# Patient Record
Sex: Female | Born: 1980 | Hispanic: No | Marital: Married | Smoking: Never smoker
Health system: Southern US, Community
[De-identification: ages and names within clinical notes are randomized; demographics above are authoritative.]

## PROBLEM LIST (undated history)

## (undated) DIAGNOSIS — Z8611 Personal history of tuberculosis: Secondary | ICD-10-CM

## (undated) DIAGNOSIS — O24419 Gestational diabetes mellitus in pregnancy, unspecified control: Secondary | ICD-10-CM

## (undated) DIAGNOSIS — Z87440 Personal history of urinary (tract) infections: Secondary | ICD-10-CM

## (undated) HISTORY — DX: Gestational diabetes mellitus in pregnancy, unspecified control: O24.419

## (undated) HISTORY — PX: NO PAST SURGERIES: SHX2092

## (undated) HISTORY — DX: Personal history of tuberculosis: Z86.11

## (undated) HISTORY — DX: Personal history of urinary (tract) infections: Z87.440

---

## 2011-04-20 ENCOUNTER — Ambulatory Visit
Admission: RE | Admit: 2011-04-20 | Discharge: 2011-04-20 | Disposition: A | Discharge status: Home / Self Care | Payer: No Typology Code available for payment source | Source: Ambulatory Visit | Attending: Infectious Diseases | Admitting: Infectious Diseases

## 2011-04-20 ENCOUNTER — Other Ambulatory Visit: Payer: Self-pay | Admitting: Infectious Diseases

## 2011-04-20 DIAGNOSIS — R9389 Abnormal findings on diagnostic imaging of other specified body structures: Secondary | ICD-10-CM

## 2011-08-17 NOTE — L&D Delivery Note (Signed)
Delivery Note At 1:45 PM a viable female was delivered via Vaginal, Vacuum (Extractor) (Presentation: Left Occiput Anterior).  Indication for vacuum was inadequate maternal pushing.  This was at least partly due to a severe language barrier, despite use of language line.  APGAR: 7, 9; weight 6 lb 9.5 oz (2991 g).   Placenta status: Intact, Spontaneous.  Cord: 3 vessels with the following complications: None.  Cord pH: pending.  Anesthesia: Epidural  Episiotomy: None Lacerations: partial 3rd degree perineal tear, side wall laceration, both in need of suture repair. Suture Repair: 3.0 vicryl vicryl rapide Est. Blood Loss (mL): 400  Mom to postpartum.  Baby to nursery-stable.  Sonia Side 06/07/2012, 2:35 PM

## 2011-12-21 ENCOUNTER — Other Ambulatory Visit (HOSPITAL_COMMUNITY): Payer: Self-pay | Admitting: Physician Assistant

## 2011-12-21 DIAGNOSIS — Z3689 Encounter for other specified antenatal screening: Secondary | ICD-10-CM

## 2011-12-21 LAB — CYTOLOGY - PAP: Pap Smear: NEGATIVE

## 2011-12-21 LAB — OB RESULTS CONSOLE HEPATITIS B SURFACE ANTIGEN: Hepatitis B Surface Ag: NEGATIVE

## 2011-12-21 LAB — OB RESULTS CONSOLE RPR: RPR: NONREACTIVE

## 2011-12-21 LAB — OB RESULTS CONSOLE ABO/RH: RH Type: POSITIVE

## 2011-12-21 LAB — OB RESULTS CONSOLE ANTIBODY SCREEN: Antibody Screen: NEGATIVE

## 2012-01-04 ENCOUNTER — Ambulatory Visit (HOSPITAL_COMMUNITY)
Admission: RE | Admit: 2012-01-04 | Discharge: 2012-01-04 | Disposition: A | Discharge status: Home / Self Care | Payer: Medicaid Other | Source: Ambulatory Visit | Attending: Physician Assistant | Admitting: Physician Assistant

## 2012-01-04 DIAGNOSIS — Z3689 Encounter for other specified antenatal screening: Secondary | ICD-10-CM | POA: Insufficient documentation

## 2012-03-16 ENCOUNTER — Other Ambulatory Visit (HOSPITAL_COMMUNITY): Payer: Self-pay | Admitting: Physician Assistant

## 2012-03-16 DIAGNOSIS — Z09 Encounter for follow-up examination after completed treatment for conditions other than malignant neoplasm: Secondary | ICD-10-CM

## 2012-03-23 ENCOUNTER — Ambulatory Visit (HOSPITAL_COMMUNITY)
Admission: RE | Admit: 2012-03-23 | Discharge: 2012-03-23 | Disposition: A | Discharge status: Home / Self Care | Payer: Medicaid Other | Source: Ambulatory Visit | Attending: Physician Assistant | Admitting: Physician Assistant

## 2012-03-23 DIAGNOSIS — Z09 Encounter for follow-up examination after completed treatment for conditions other than malignant neoplasm: Secondary | ICD-10-CM

## 2012-03-23 DIAGNOSIS — O365990 Maternal care for other known or suspected poor fetal growth, unspecified trimester, not applicable or unspecified: Secondary | ICD-10-CM | POA: Insufficient documentation

## 2012-03-23 DIAGNOSIS — Z3689 Encounter for other specified antenatal screening: Secondary | ICD-10-CM | POA: Insufficient documentation

## 2012-04-13 ENCOUNTER — Other Ambulatory Visit (HOSPITAL_COMMUNITY): Payer: Self-pay | Admitting: Family

## 2012-04-13 DIAGNOSIS — Z1389 Encounter for screening for other disorder: Secondary | ICD-10-CM

## 2012-04-19 ENCOUNTER — Ambulatory Visit (HOSPITAL_COMMUNITY): Payer: Medicaid Other

## 2012-04-19 ENCOUNTER — Ambulatory Visit (HOSPITAL_COMMUNITY)
Admission: RE | Admit: 2012-04-19 | Discharge: 2012-04-19 | Disposition: A | Discharge status: Home / Self Care | Payer: Medicaid Other | Source: Ambulatory Visit | Attending: Family | Admitting: Family

## 2012-04-19 DIAGNOSIS — Z1389 Encounter for screening for other disorder: Secondary | ICD-10-CM

## 2012-04-19 DIAGNOSIS — O365990 Maternal care for other known or suspected poor fetal growth, unspecified trimester, not applicable or unspecified: Secondary | ICD-10-CM | POA: Insufficient documentation

## 2012-04-19 DIAGNOSIS — O358XX0 Maternal care for other (suspected) fetal abnormality and damage, not applicable or unspecified: Secondary | ICD-10-CM | POA: Insufficient documentation

## 2012-05-29 ENCOUNTER — Other Ambulatory Visit (HOSPITAL_COMMUNITY): Payer: Self-pay | Admitting: Nurse Practitioner

## 2012-05-29 ENCOUNTER — Ambulatory Visit (HOSPITAL_COMMUNITY)
Admission: RE | Admit: 2012-05-29 | Discharge: 2012-05-29 | Disposition: A | Discharge status: Home / Self Care | Payer: Medicaid Other | Source: Ambulatory Visit | Attending: Nurse Practitioner | Admitting: Nurse Practitioner

## 2012-05-29 DIAGNOSIS — O358XX0 Maternal care for other (suspected) fetal abnormality and damage, not applicable or unspecified: Secondary | ICD-10-CM | POA: Insufficient documentation

## 2012-05-29 DIAGNOSIS — O365990 Maternal care for other known or suspected poor fetal growth, unspecified trimester, not applicable or unspecified: Secondary | ICD-10-CM | POA: Insufficient documentation

## 2012-06-02 ENCOUNTER — Encounter (HOSPITAL_COMMUNITY): Payer: Self-pay | Admitting: *Deleted

## 2012-06-02 ENCOUNTER — Telehealth (HOSPITAL_COMMUNITY): Payer: Self-pay | Admitting: *Deleted

## 2012-06-02 NOTE — Telephone Encounter (Signed)
Preadmission screen Interpreter number [phone]

## 2012-06-06 ENCOUNTER — Inpatient Hospital Stay (HOSPITAL_COMMUNITY)
Admission: AD | Admit: 2012-06-06 | Discharge: 2012-06-09 | DRG: 775 | Disposition: A | Discharge status: Home / Self Care | Payer: Medicaid Other | Source: Ambulatory Visit | Attending: Obstetrics & Gynecology | Admitting: Obstetrics & Gynecology

## 2012-06-06 ENCOUNTER — Inpatient Hospital Stay (HOSPITAL_COMMUNITY): Admission: AD | Admit: 2012-06-06 | Payer: Self-pay | Source: Ambulatory Visit | Admitting: Obstetrics & Gynecology

## 2012-06-06 ENCOUNTER — Telehealth: Payer: Self-pay | Admitting: Obstetrics & Gynecology

## 2012-06-06 ENCOUNTER — Ambulatory Visit (INDEPENDENT_AMBULATORY_CARE_PROVIDER_SITE_OTHER): Payer: Medicaid Other | Admitting: *Deleted

## 2012-06-06 ENCOUNTER — Encounter (HOSPITAL_COMMUNITY): Payer: Self-pay | Admitting: *Deleted

## 2012-06-06 VITALS — BP 104/68 | Wt 142.6 lb

## 2012-06-06 DIAGNOSIS — O480 Post-term pregnancy: Secondary | ICD-10-CM | POA: Diagnosis present

## 2012-06-06 DIAGNOSIS — O359XX0 Maternal care for (suspected) fetal abnormality and damage, unspecified, not applicable or unspecified: Secondary | ICD-10-CM

## 2012-06-06 DIAGNOSIS — O4100X0 Oligohydramnios, unspecified trimester, not applicable or unspecified: Secondary | ICD-10-CM | POA: Insufficient documentation

## 2012-06-06 LAB — CBC
HCT: 36.8 % (ref 36.0–46.0)
Hemoglobin: 12.4 g/dL (ref 12.0–15.0)
MCH: 28.4 pg (ref 26.0–34.0)
MCV: 84.4 fL (ref 78.0–100.0)
RBC: 4.36 MIL/uL (ref 3.87–5.11)

## 2012-06-06 MED ORDER — ACETAMINOPHEN 325 MG PO TABS: 650.0000 mg | ORAL_TABLET | ORAL | Status: DC | PRN

## 2012-06-06 MED ORDER — CITRIC ACID-SODIUM CITRATE 334-500 MG/5ML PO SOLN: 30.0000 mL | ORAL | Status: DC | PRN

## 2012-06-06 MED ORDER — OXYTOCIN 40 UNITS IN LACTATED RINGERS INFUSION - SIMPLE MED
62.5000 mL/h | INTRAVENOUS | Status: DC
  Filled 2012-06-06: qty 1000

## 2012-06-06 MED ORDER — LIDOCAINE HCL (PF) 1 % IJ SOLN: 30.0000 mL | INTRAMUSCULAR | Status: DC | PRN

## 2012-06-06 MED ORDER — LACTATED RINGERS IV SOLN: 500.0000 mL | INTRAVENOUS | Status: DC | PRN

## 2012-06-06 MED ORDER — OXYTOCIN BOLUS FROM INFUSION
500.0000 mL | INTRAVENOUS | Status: DC
  Filled 2012-06-06 (×94): qty 500

## 2012-06-06 MED ORDER — MISOPROSTOL 25 MCG QUARTER TABLET
25.0000 ug | ORAL_TABLET | ORAL | Status: DC | PRN
  Administered 2012-06-06 – 2012-06-07 (×3): 25 ug via VAGINAL
  Filled 2012-06-06 (×3): qty 0.25

## 2012-06-06 MED ORDER — OXYCODONE-ACETAMINOPHEN 5-325 MG PO TABS: 1.0000 | ORAL_TABLET | ORAL | Status: DC | PRN

## 2012-06-06 MED ORDER — TERBUTALINE SULFATE 1 MG/ML IJ SOLN: 0.2500 mg | Freq: Once | INTRAMUSCULAR | Status: AC | PRN

## 2012-06-06 MED ORDER — ONDANSETRON HCL 4 MG/2ML IJ SOLN: 4.0000 mg | Freq: Four times a day (QID) | INTRAMUSCULAR | Status: DC | PRN

## 2012-06-06 MED ORDER — LACTATED RINGERS IV SOLN
INTRAVENOUS | Status: DC
  Administered 2012-06-06 – 2012-06-07 (×3): via INTRAVENOUS

## 2012-06-06 MED ORDER — FENTANYL CITRATE 0.05 MG/ML IJ SOLN
100.0000 ug | INTRAMUSCULAR | Status: DC | PRN
  Administered 2012-06-07 (×3): 100 ug via INTRAVENOUS
  Filled 2012-06-06 (×3): qty 2

## 2012-06-06 MED ORDER — IBUPROFEN 600 MG PO TABS
600.0000 mg | ORAL_TABLET | Freq: Four times a day (QID) | ORAL | Status: DC | PRN
  Administered 2012-06-07: 600 mg via ORAL
  Filled 2012-06-06: qty 1

## 2012-06-06 NOTE — Telephone Encounter (Signed)
OB Attending Phone Call  Patient was called at home with the help a [facility] interpreter for Burmese [phone].  She was asked why she has not shown up for her recommended induction of labor for oligohydramnios at [redacted]w[redacted]d. Patient reports that her husband just returned home and will be bringing her to the hospital soon.  Admitting staff informed of patient's expected arrival soon; L&D aware.  Emphasized importance of coming in soon as recommended given concern about oligohydramnios and possible worsening fetal condition.  Jaynie Collins, MD, FACOG Attending Obstetrician & Gynecologist Faculty Practice, [facility]

## 2012-06-06 NOTE — Progress Notes (Signed)
P - 69 

## 2012-06-06 NOTE — Progress Notes (Signed)
[employer] Interpretor on telephone with Dr. Claiborne Billings in room to discuss plan of care.

## 2012-06-06 NOTE — Progress Notes (Signed)
Postdates NST performed today was reviewed and was found to be reactive. However, the AFI was 4 cm, cephalic. Given oligohydramnios, will proceed with IOL. Patient informed about recommendation using a physical Burmese interpreter, and she agreed with plan. She will go home briefly and return for IOL as a direct admission to L&D.  L&D charge RN, Admissions notified.

## 2012-06-06 NOTE — Progress Notes (Signed)
Destony Pounders is a 31 y.o. G1P0000 at [redacted]w[redacted]d by LMP admitted for induction of labor due to Post dates. Due date Estimated Date of Delivery: 06/02/12 and oligohydramnios.  .  Subjective: Patient not uncomfortable. Father of baby going home for the evening. Translator used for communication.  Objective: BP 116/74  Pulse 69  Temp 97.3 F (36.3 C) (Axillary)  Resp 20  Ht 5' (1.524 m)  Wt 64.864 kg (143 lb)  BMI 27.93 kg/m2  LMP 08/27/2011      FHT:  FHR: 135 bpm, variability: moderate,  accelerations:  Present,  decelerations:  Absent UC:   irregular, every 3-10 minutes SVE:   FT/thick/posterior (unchanged from prior exam Labs: Lab Results  Component Value Date   WBC 8.3 06/06/2012   HGB 12.4 06/06/2012   HCT 36.8 06/06/2012   MCV 84.4 06/06/2012   PLT 198 06/06/2012    Assessment / Plan: Induction of labor due to oligohydramnios,  progressing well on pitocin Cytotec #2 placed  Labor: Cytotec cervical ripening Preeclampsia:  labs stable and no PreE Fetal Wellbeing:  Category I Pain Control:  No pain I/D:none Anticipated MOD:  NSVD  Arham Symmonds 06/06/2012, 9:47 PM

## 2012-06-06 NOTE — H&P (Signed)
Drinda Coole is a 31 y.o. female presenting for IOL due to oligo (AFI 4) History Patient has not had vaginal bleeding, contractions, or fluid leakage.  She has felt normal fetal movement in past hour.  Patient was found to have decreased amniotic fluid index of 4 in clinic, scheduled for induction today.  No other complications during her pregnancy.  She takes not medications other than a prenatal vitamin.  She has had no previous pregnancies, deliveries, or C-sections.  She denies any other symptoms.  OB History    Grav Para Term Preterm Abortions TAB SAB Ect Mult Living   1 0 0 0 0 0 0 0 0 0      Past Medical History  Diagnosis Date  . Hx of tuberculosis     treated  . Hx: UTI (urinary tract infection)    Past Surgical History  Procedure Date  . No past surgeries    Family History: family history is not on file. Social History:  reports that she has never smoked. She has never used smokeless tobacco. She reports that she does not drink alcohol or use illicit drugs.   Prenatal Transfer Tool  Maternal Diabetes: No Genetic Screening: Normal Maternal Ultrasounds/Referrals: Abnormal:  Findings:   Fetal Kidney Anomalies bilateral pyelectasis Fetal Ultrasounds or other Referrals:  None Maternal Substance Abuse:  No Significant Maternal Medications:  None Significant Maternal Lab Results:  None Other Comments:  None  ROS  Dilation: Fingertip Effacement (%): Thick Station: -3 Exam by:: Kyriakos Babler Blood pressure 117/72, pulse 79, temperature 97.6 F (36.4 C), temperature source Oral, resp. rate 20, height 5' (1.524 m), weight 64.864 kg (143 lb), last menstrual period 08/27/2011.  Exam Physical Exam  Gen: AAOx3, NAD CV: RRR, no MRG, pulses intact b/l Pulm: CTA Abd: Soft, NT/ND, +BS Cervical exam:Dilation: Fingertip Effacement (%): Thick Station: -3 Presentation: Vertex Exam by:: Delta Deshmukh  FHT:  Prenatal labs: ABO, Rh: B/Positive/-- (05/07 0000) Antibody: Negative (05/07  0000) Rubella: Immune (05/07 0000) RPR: Nonreactive (05/07 0000)  HBsAg: Negative (05/07 0000)  HIV: Non-reactive (05/07 0000)  GBS: Negative (10/01 0000)   FHT: BHR: 150, accels present, decels present: 1 possible decel >2 min, <10 with minimal variability after return to baseline.  Moderate variability, contractions q38mins, irregular.  Assessment/Plan: 31 y.o. female presenting for IOL due to oligo (AFI 4)  #IOL due to oligo - unfavorable cervix- cytotec - re-check in four hours - plan for foley bulb or pit once favorable - no other co-morbidities - anticipate vaginal delivery  Sonia Side 06/06/2012, 3:39 PM

## 2012-06-06 NOTE — H&P (Signed)
Attestation of Attending Supervision of Advanced Practitioner (CNM/NP): Evaluation and management procedures were performed by the Advanced Practitioner under my supervision and collaboration.  I have reviewed the Advanced Practitioner's note and chart, and I agree with the management and plan.  Akshaya Toepfer, MD, FACOG Attending Obstetrician & Gynecologist Faculty Practice, [facility]  

## 2012-06-07 ENCOUNTER — Encounter (HOSPITAL_COMMUNITY): Payer: Self-pay | Admitting: Anesthesiology

## 2012-06-07 ENCOUNTER — Inpatient Hospital Stay (HOSPITAL_COMMUNITY): Payer: Medicaid Other | Admitting: Anesthesiology

## 2012-06-07 ENCOUNTER — Encounter (HOSPITAL_COMMUNITY): Payer: Self-pay | Admitting: *Deleted

## 2012-06-07 DIAGNOSIS — O480 Post-term pregnancy: Secondary | ICD-10-CM

## 2012-06-07 DIAGNOSIS — O4100X0 Oligohydramnios, unspecified trimester, not applicable or unspecified: Secondary | ICD-10-CM

## 2012-06-07 MED ORDER — BENZOCAINE-MENTHOL 20-0.5 % EX AERO
1.0000 "application " | INHALATION_SPRAY | CUTANEOUS | Status: DC | PRN
  Administered 2012-06-07: 1 via TOPICAL
  Filled 2012-06-07: qty 56

## 2012-06-07 MED ORDER — SIMETHICONE 80 MG PO CHEW: 80.0000 mg | CHEWABLE_TABLET | ORAL | Status: DC | PRN

## 2012-06-07 MED ORDER — TETANUS-DIPHTH-ACELL PERTUSSIS 5-2.5-18.5 LF-MCG/0.5 IM SUSP: 0.5000 mL | Freq: Once | INTRAMUSCULAR | Status: DC

## 2012-06-07 MED ORDER — IBUPROFEN 600 MG PO TABS
600.0000 mg | ORAL_TABLET | Freq: Four times a day (QID) | ORAL | Status: DC
  Administered 2012-06-07 – 2012-06-09 (×7): 600 mg via ORAL
  Filled 2012-06-07 (×7): qty 1

## 2012-06-07 MED ORDER — PHENYLEPHRINE 40 MCG/ML (10ML) SYRINGE FOR IV PUSH (FOR BLOOD PRESSURE SUPPORT)
80.0000 ug | PREFILLED_SYRINGE | INTRAVENOUS | Status: DC | PRN
  Filled 2012-06-07: qty 5

## 2012-06-07 MED ORDER — ZOLPIDEM TARTRATE 5 MG PO TABS: 5.0000 mg | ORAL_TABLET | Freq: Every evening | ORAL | Status: DC | PRN

## 2012-06-07 MED ORDER — OXYTOCIN 40 UNITS IN LACTATED RINGERS INFUSION - SIMPLE MED
1.0000 m[IU]/min | INTRAVENOUS | Status: DC
  Administered 2012-06-07: 2 m[IU]/min via INTRAVENOUS

## 2012-06-07 MED ORDER — WITCH HAZEL-GLYCERIN EX PADS: 1.0000 "application " | MEDICATED_PAD | CUTANEOUS | Status: DC | PRN

## 2012-06-07 MED ORDER — LANOLIN HYDROUS EX OINT: TOPICAL_OINTMENT | CUTANEOUS | Status: DC | PRN

## 2012-06-07 MED ORDER — OXYCODONE-ACETAMINOPHEN 5-325 MG PO TABS
1.0000 | ORAL_TABLET | ORAL | Status: DC | PRN
  Administered 2012-06-08: 1 via ORAL
  Filled 2012-06-07: qty 1

## 2012-06-07 MED ORDER — ONDANSETRON HCL 4 MG/2ML IJ SOLN: 4.0000 mg | INTRAMUSCULAR | Status: DC | PRN

## 2012-06-07 MED ORDER — SENNOSIDES-DOCUSATE SODIUM 8.6-50 MG PO TABS
2.0000 | ORAL_TABLET | Freq: Every day | ORAL | Status: DC
  Administered 2012-06-07 – 2012-06-08 (×2): 2 via ORAL

## 2012-06-07 MED ORDER — PHENYLEPHRINE 40 MCG/ML (10ML) SYRINGE FOR IV PUSH (FOR BLOOD PRESSURE SUPPORT): 80.0000 ug | PREFILLED_SYRINGE | INTRAVENOUS | Status: DC | PRN

## 2012-06-07 MED ORDER — DIPHENHYDRAMINE HCL 25 MG PO CAPS: 25.0000 mg | ORAL_CAPSULE | Freq: Four times a day (QID) | ORAL | Status: DC | PRN

## 2012-06-07 MED ORDER — EPHEDRINE 5 MG/ML INJ
10.0000 mg | INTRAVENOUS | Status: DC | PRN
  Filled 2012-06-07: qty 4

## 2012-06-07 MED ORDER — PRENATAL MULTIVITAMIN CH
1.0000 | ORAL_TABLET | Freq: Every day | ORAL | Status: DC
  Administered 2012-06-09: 1 via ORAL
  Filled 2012-06-07: qty 1

## 2012-06-07 MED ORDER — INFLUENZA VIRUS VACC SPLIT PF IM SUSP
0.5000 mL | INTRAMUSCULAR | Status: AC
  Administered 2012-06-09: 0.5 mL via INTRAMUSCULAR
  Filled 2012-06-07 (×2): qty 0.5

## 2012-06-07 MED ORDER — DIBUCAINE 1 % RE OINT: 1.0000 "application " | TOPICAL_OINTMENT | RECTAL | Status: DC | PRN

## 2012-06-07 MED ORDER — ONDANSETRON HCL 4 MG PO TABS: 4.0000 mg | ORAL_TABLET | ORAL | Status: DC | PRN

## 2012-06-07 MED ORDER — LIDOCAINE HCL (PF) 1 % IJ SOLN
INTRAMUSCULAR | Status: DC | PRN
  Administered 2012-06-07 (×2): 5 mL

## 2012-06-07 MED ORDER — LACTATED RINGERS IV SOLN
500.0000 mL | Freq: Once | INTRAVENOUS | Status: AC
  Administered 2012-06-07: 500 mL via INTRAVENOUS

## 2012-06-07 MED ORDER — TERBUTALINE SULFATE 1 MG/ML IJ SOLN: 0.2500 mg | Freq: Once | INTRAMUSCULAR | Status: DC | PRN

## 2012-06-07 MED ORDER — FENTANYL 2.5 MCG/ML BUPIVACAINE 1/10 % EPIDURAL INFUSION (WH - ANES)
14.0000 mL/h | INTRAMUSCULAR | Status: DC
  Administered 2012-06-07: 14 mL/h via EPIDURAL
  Filled 2012-06-07: qty 125

## 2012-06-07 MED ORDER — EPHEDRINE 5 MG/ML INJ: 10.0000 mg | INTRAVENOUS | Status: DC | PRN

## 2012-06-07 MED ORDER — DIPHENHYDRAMINE HCL 50 MG/ML IJ SOLN: 12.5000 mg | INTRAMUSCULAR | Status: DC | PRN

## 2012-06-07 NOTE — Progress Notes (Signed)
[employer] interpreter used to explain epidural procedure to patient & to have patient in correct position for safe procedure. Risks & benefits explained to patient. Patient consented to having epidural placed by Dr. Sheral Apley. Each step of epidural process explained to patient. Patient given opportunities to ask questions. Patient had no questions before, during or after procedure. Will continue to assess and answer questions of patient as needed via [place] interpreters.

## 2012-06-07 NOTE — Progress Notes (Signed)
Interpreter line called; pt educated about pain meds;

## 2012-06-07 NOTE — Progress Notes (Signed)
Patient ID: Sharon Mcconnell, female   DOB: [birth year], 31 y.o.   MRN: [id number] Sharon Mcconnell is a 31 y.o. G1P0000 at [redacted]w[redacted]d by LMP admitted for induction of labor due to Post dates. Due date Estimated Date of Delivery: 06/02/12 and oligohydramnios.  Subjective: Patient has become uncomfortable over the past few hours requiring IV pain medication x3.  Objective: BP 123/72  Pulse 61  Temp 97.8 F (36.6 C) (Oral)  Resp 16  Ht 5' (1.524 m)  Wt 64.864 kg (143 lb)  BMI 27.93 kg/m2  LMP 08/27/2011     FHT:  FHR: 125 bpm, variability: moderate,  accelerations:  Present,  decelerations:  Absent UC:   irregular SVE:   Dilation: 5 Effacement (%): 50 Cervical Position: Posterior Station: -1 Presentation: Vertex Exam by:: k. shaw, cnm/r.kuneff, md  Labs: Lab Results  Component Value Date   WBC 8.3 06/06/2012   HGB 12.4 06/06/2012   HCT 36.8 06/06/2012   MCV 84.4 06/06/2012   PLT 198 06/06/2012    Assessment / Plan: Induction of labor Cytotec #3 placed at 1:30am Consider AROM with next cervical check.   Labor: Cytotec x3 doses; last dose applied at 1:30am Preeclampsia:  labs stable and no PreE Fetal Wellbeing:  Category I Pain Control:  IV pain medications; May have epidural I/D:none Anticipated MOD:  NSVD  Felix Pacini 06/07/2012, 5:35 AM

## 2012-06-07 NOTE — Progress Notes (Signed)
Sharon Mcconnell is a 31 y.o. G1P0000 at [redacted]w[redacted]d by LMP admitted for induction of labor due to Post dates. Due date Estimated Date of Delivery: 06/02/12 and oligohydramnios.  Subjective: Patient not uncomfortable. Sleeping.  Objective: BP 109/72  Pulse 85  Temp 97.7 F (36.5 C) (Axillary)  Resp 20  Ht 5' (1.524 m)  Wt 64.864 kg (143 lb)  BMI 27.93 kg/m2  LMP 08/27/2011      FHT:  FHR: 135 bpm, variability: moderate,  accelerations:  Present,  decelerations:  Absent UC:   irregular SVE:   Dilation: 1.5 Effacement (%): Thick Cervical Position: Posterior Station: -3 Presentation: Vertex Exam by:: Dr. Claiborne Billings  Labs: Lab Results  Component Value Date   WBC 8.3 06/06/2012   HGB 12.4 06/06/2012   HCT 36.8 06/06/2012   MCV 84.4 06/06/2012   PLT 198 06/06/2012    Assessment / Plan: Induction of labor Cytotec #3 placed at 1:30am Pitocin induction to start in a.m.   Labor: Cytotec cervical ripening Preeclampsia:  labs stable and no PreE Fetal Wellbeing:  Category I Pain Control:  No pain I/D:none Anticipated MOD:  NSVD  Kuneff, Renee 06/07/2012, 1:37 AM

## 2012-06-07 NOTE — Progress Notes (Signed)
I saw and examined patient along with student and agree with above note.   FRAZIER,NATALIE 06/07/2012 3:05 PM   

## 2012-06-07 NOTE — Plan of Care (Signed)
Problem: Phase I Progression Outcomes Goal: Other Phase I Outcomes/Goals Pt. Is Burmese, unable to speak [place] and has ethnic /cultural beliefs. Interpreter utilized to determine dietary needs. Dietary menu limited selections for her needs. Support systems in the area appear limited. Suggest dietary consult, with interpreter, for choices and options to maintain adequate nutrition for post partum/ lactating mother

## 2012-06-07 NOTE — Anesthesia Procedure Notes (Signed)
Epidural Patient location during procedure: OB Start time: 06/07/2012 6:19 AM  Staffing Anesthesiologist: Brayton Caves R Performed by: anesthesiologist   Preanesthetic Checklist Completed: patient identified, site marked, surgical consent, pre-op evaluation, timeout performed, IV checked, risks and benefits discussed and monitors and equipment checked  Epidural Patient position: sitting Prep: site prepped and draped and DuraPrep Patient monitoring: continuous pulse ox and blood pressure Approach: midline Injection technique: LOR air and LOR saline  Needle:  Needle type: Tuohy  Needle gauge: 17 G Needle length: 9 cm and 9 Needle insertion depth: 5 cm cm Catheter type: closed end flexible Catheter size: 19 Gauge Catheter at skin depth: 10 cm Test dose: negative  Assessment Events: blood not aspirated, injection not painful, no injection resistance, negative IV test and no paresthesia  Additional Notes Patient identified.  Risk benefits discussed including failed block, incomplete pain control, headache, nerve damage, paralysis, blood pressure changes, nausea, vomiting, reactions to medication both toxic or allergic, and postpartum back pain.  Patient expressed understanding and wished to proceed.  All questions were answered.  Sterile technique used throughout procedure and epidural site dressed with sterile barrier dressing. No paresthesia or other complications noted.The patient did not experience any signs of intravascular injection such as tinnitus or metallic taste in mouth nor signs of intrathecal spread such as rapid motor block. Please see nursing notes for vital signs.

## 2012-06-07 NOTE — Progress Notes (Signed)
[employer] Interpreter used to inform patient of foley bulb, side effects & explanation of use/procedure. Patient had no questions at that time. Ok to proceed with foley bulb. Interpreter kept on phone in case patient had further questions.

## 2012-06-07 NOTE — Progress Notes (Signed)
Patient ID: Sharon Mcconnell, female   DOB: Jun 25, 1981, 31 y.o.   MRN: [id number] Sharon Mcconnell is a 31 y.o. G1P0000 at [redacted]w[redacted]d by LMP admitted for induction of labor due to Post dates. Due date Estimated Date of Delivery: 06/02/12 and oligohydramnios.  Subjective: Patient comfortable, starting to feel pelvic pressure, treating with epidural  Objective: BP 122/79  Pulse 81  Temp 98.2 F (36.8 C) (Oral)  Resp 18  Ht 5' (1.524 m)  Wt 64.864 kg (143 lb)  BMI 27.93 kg/m2  SpO2 94%  LMP 08/27/2011   Total I/O In: -  Out: 650 [Urine:650] FHT:  BHR: 130, mod variability, accels present, decels absent.  UC:   irregular SVE:   Dilation: 10 Dilation Complete Date: 06/07/12 Dilation Complete Time: 1022 Effacement (%): 100 Cervical Position: Middle Station: 0 Presentation: Vertex Exam by:: [employer]: Lab Results  Component Value Date   WBC 8.3 06/06/2012   HGB 12.4 06/06/2012   HCT 36.8 06/06/2012   MCV 84.4 06/06/2012   PLT 198 06/06/2012    Assessment / Plan: Induction of labor Cytotec #3 placed at 1:30am, SROM at 6:00am  Labor: Complete, Station 0, infrequent contractions, will start Pit to assist.   Preeclampsia:  labs stable and no PreE Fetal Wellbeing:  Category I Pain Control:  IV pain medications; May have epidural I/D:none Anticipated MOD:  NSVD  Sonia Side 06/07/2012, 9:53 AM

## 2012-06-07 NOTE — Anesthesia Preprocedure Evaluation (Signed)
Anesthesia Evaluation  Patient identified by MRN, date of birth, ID band Patient awake    Reviewed: Allergy & Precautions, H&P , Patient's Chart, lab work & pertinent test results  Airway Mallampati: II TM Distance: >3 FB Neck ROM: full    Dental No notable dental hx.    Pulmonary neg pulmonary ROS,  breath sounds clear to auscultation  Pulmonary exam normal       Cardiovascular Exercise Tolerance: Good negative cardio ROS  Rhythm:regular Rate:Normal     Neuro/Psych negative neurological ROS  negative psych ROS   GI/Hepatic negative GI ROS, Neg liver ROS,   Endo/Other  negative endocrine ROS  Renal/GU negative Renal ROS     Musculoskeletal negative musculoskeletal ROS (+)   Abdominal   Peds negative pediatric ROS (+)  Hematology negative hematology ROS (+)   Anesthesia Other Findings Hx of tuberculosis   treated Hx: UTI (urinary tract infection)   Reproductive/Obstetrics negative OB ROS (+) Pregnancy                           Anesthesia Physical Anesthesia Plan  ASA: II  Anesthesia Plan: Epidural   Post-op Pain Management:    Induction:   Airway Management Planned:   Additional Equipment:   Intra-op Plan:   Post-operative Plan:   Informed Consent: I have reviewed the patients History and Physical, chart, labs and discussed the procedure including the risks, benefits and alternatives for the proposed anesthesia with the patient or authorized representative who has indicated his/her understanding and acceptance.     Plan Discussed with:   Anesthesia Plan Comments:         Anesthesia Quick Evaluation

## 2012-06-08 LAB — CBC
Hemoglobin: 10.4 g/dL — ABNORMAL LOW (ref 12.0–15.0)
MCH: 28.4 pg (ref 26.0–34.0)
RBC: 3.66 MIL/uL — ABNORMAL LOW (ref 3.87–5.11)
WBC: 12.1 10*3/uL — ABNORMAL HIGH (ref 4.0–10.5)

## 2012-06-08 LAB — TYPE AND SCREEN
DAT, IgG: NEGATIVE
Unit division: 0
Unit division: 0

## 2012-06-08 NOTE — Plan of Care (Signed)
Problem: Phase I Progression Outcomes Goal: Other Phase I Outcomes/Goals Altered diet going well. Soc. Worker will meet with Burmese interpreter on 06/09/12 to discuss d/c arragements and needs.

## 2012-06-08 NOTE — Anesthesia Postprocedure Evaluation (Signed)
  Anesthesia Post-op Note  Patient: Sharon Mcconnell  Procedure(s) Performed: * No procedures listed *  Patient Location: Mother/Baby  Anesthesia Type: Epidural  Level of Consciousness: awake and alert   Airway and Oxygen Therapy: Patient Spontanous Breathing  Post-op Pain: none  Post-op Assessment: Patient's Cardiovascular Status Stable, Respiratory Function Stable, RESPIRATORY FUNCTION UNSTABLE, Patent Airway, No signs of Nausea or vomiting, Adequate PO intake, Pain level controlled, No headache, No backache, No residual numbness and No residual motor weakness  Post-op Vital Signs: Reviewed and stable  Complications: No apparent anesthesia complications

## 2012-06-08 NOTE — Progress Notes (Signed)
Post Partum Day 1 Subjective: up ad lib, voiding, tolerating PO and reporting moderate vaginal pain. Changed pad 3 times through the night.   Objective: Blood pressure 99/58, pulse 77, temperature 97.8 F (36.6 C), temperature source Oral, resp. rate 18, height 5' (1.524 m), weight 64.864 kg (143 lb), last menstrual period 08/27/2011, SpO2 98.00%, unknown if currently breastfeeding.  Physical Exam:  General: alert, cooperative and no distress Lochia: appropriate Uterine Fundus: firm Incision: none DVT Evaluation: No evidence of DVT seen on physical exam.   Basename 06/08/12 0510 06/06/12 1455  HGB 10.4* 12.4  HCT 30.9* 36.8    Assessment/Plan: Plan for discharge tomorrow and Breastfeeding No circumcision planned   LOS: 2 days   Sharon Mcconnell 06/08/2012, 7:48 AM    I examined pt and agree with documentation above and resident plan of care. [facility]

## 2012-06-08 NOTE — Progress Notes (Signed)
Sw arranged for a Bur[place]ese interpreter to come tomorrow morning at 10am, to help staff with discharge.  

## 2012-06-08 NOTE — Progress Notes (Signed)
UR Chart review completed.  

## 2012-06-09 ENCOUNTER — Ambulatory Visit (INDEPENDENT_AMBULATORY_CARE_PROVIDER_SITE_OTHER): Payer: Medicaid Other

## 2012-06-09 VITALS — BP 103/67 | HR 70 | Ht 60.0 in | Wt 139.7 lb

## 2012-06-09 DIAGNOSIS — Z3049 Encounter for surveillance of other contraceptives: Secondary | ICD-10-CM

## 2012-06-09 MED ORDER — NORETHINDRONE 0.35 MG PO TABS: ORAL_TABLET | ORAL | Status: DC

## 2012-06-09 MED ORDER — MEDROXYPROGESTERONE ACETATE 150 MG/ML IM SUSP
150.0000 mg | Freq: Once | INTRAMUSCULAR | Status: AC
  Administered 2012-06-09: 150 mg via INTRAMUSCULAR

## 2012-06-09 MED ORDER — SENNOSIDES-DOCUSATE SODIUM 8.6-50 MG PO TABS: 2.0000 | ORAL_TABLET | Freq: Every day | ORAL | Status: DC

## 2012-06-09 MED ORDER — IBUPROFEN 600 MG PO TABS: 600.0000 mg | ORAL_TABLET | Freq: Four times a day (QID) | ORAL | Status: DC

## 2012-06-09 NOTE — Discharge Summary (Signed)
Obstetric Discharge Summary Reason for Admission: induction of labor Prenatal Procedures: none Intrapartum Procedures: vacuum Postpartum Procedures: none Complications-Operative and Postpartum: 3rd degree perineal laceration  Pt reports mild bleeding through the night, she changed her pad 4 times. Is ambulating well, reports good appetite. Eating and drinking well. Pt is bottle and breast feeding without difficulty. Reports mild abdominal pain that is relieved with motrin. Denies nausea or vomiting.   Patient is interested in Depo Provera for birth control. Will see [facility] for postpartum visit.  Hemoglobin  Date Value Range Status  06/08/2012 10.4* 12.0 - 15.0 g/dL Final     HCT  Date Value Range Status  06/08/2012 30.9* 36.0 - 46.0 % Final    Physical Exam:  General: alert, cooperative and no distress Lochia: appropriate Uterine Fundus: firm DVT Evaluation: No evidence of DVT seen on physical exam.  Discharge Diagnoses: Term Pregnancy-delivered  Discharge Information: Date: 06/09/2012 Activity: unrestricted Diet: routine Medications: Ibuprofen, Senna/docusate Condition: stable Discharge to: home   Newborn Data: Live born female  Birth Weight: 6 lb 9.5 oz (2991 g) APGAR: 7, 9  Home with mother.  Harriet Masson 06/09/2012, 9:00 AM  I have seen this patient and agree with the above PA student's note with the following addition.  Pt will go down to [facility] today for Depo shot before noon. SW and Pharmacist, community to see pt prior to D/C regarding WIC, appointments for postpartum, for baby visit, etc.  [employer], Civil engineer, contracting Nurse-Midwife

## 2012-06-12 ENCOUNTER — Ambulatory Visit: Payer: Medicaid Other

## 2012-06-12 ENCOUNTER — Inpatient Hospital Stay (HOSPITAL_COMMUNITY): Admission: RE | Admit: 2012-06-12 | Payer: Medicaid Other | Source: Ambulatory Visit

## 2012-08-29 ENCOUNTER — Inpatient Hospital Stay (HOSPITAL_COMMUNITY)
Admission: AD | Admit: 2012-08-29 | Discharge: 2012-08-29 | Disposition: A | Discharge status: Home / Self Care | Payer: Medicaid Other | Source: Ambulatory Visit | Attending: Family Medicine | Admitting: Family Medicine

## 2012-08-29 ENCOUNTER — Encounter (HOSPITAL_COMMUNITY): Payer: Self-pay | Admitting: Obstetrics and Gynecology

## 2012-08-29 DIAGNOSIS — N10 Acute tubulo-interstitial nephritis: Secondary | ICD-10-CM

## 2012-08-29 DIAGNOSIS — O2390 Unspecified genitourinary tract infection in pregnancy, unspecified trimester: Secondary | ICD-10-CM | POA: Insufficient documentation

## 2012-08-29 DIAGNOSIS — N12 Tubulo-interstitial nephritis, not specified as acute or chronic: Secondary | ICD-10-CM | POA: Insufficient documentation

## 2012-08-29 DIAGNOSIS — R509 Fever, unspecified: Secondary | ICD-10-CM | POA: Insufficient documentation

## 2012-08-29 LAB — URINALYSIS, ROUTINE W REFLEX MICROSCOPIC
Glucose, UA: NEGATIVE mg/dL
Ketones, ur: NEGATIVE mg/dL
Specific Gravity, Urine: 1.015 (ref 1.005–1.030)
pH: 6 (ref 5.0–8.0)

## 2012-08-29 LAB — URINE MICROSCOPIC-ADD ON

## 2012-08-29 MED ORDER — LACTATED RINGERS IV BOLUS (SEPSIS)
500.0000 mL | Freq: Once | INTRAVENOUS | Status: AC
  Administered 2012-08-29: 1000 mL via INTRAVENOUS

## 2012-08-29 MED ORDER — ACETAMINOPHEN 500 MG PO TABS
1000.0000 mg | ORAL_TABLET | Freq: Once | ORAL | Status: AC
  Administered 2012-08-29: 1000 mg via ORAL

## 2012-08-29 MED ORDER — CEFTRIAXONE SODIUM 1 G IJ SOLR
1.0000 g | Freq: Once | INTRAMUSCULAR | Status: AC
  Administered 2012-08-29: 1 g via INTRAMUSCULAR
  Filled 2012-08-29: qty 10

## 2012-08-29 MED ORDER — CEPHALEXIN 500 MG PO CAPS: 500.0000 mg | ORAL_CAPSULE | Freq: Four times a day (QID) | ORAL | Status: DC

## 2012-08-29 NOTE — MAU Note (Signed)
Patient had baby two months ago, on Sunday starting having fever, congestion and back pain.

## 2012-08-29 NOTE — MAU Provider Note (Signed)
Chart reviewed and agree with management and plan.  

## 2012-08-29 NOTE — MAU Note (Signed)
"  I had a baby here 2 months ago.  I started to not feel well on Sunday 08/27/12.  I have a fever with muscle aches, chills and cold.  No coughing, but I have pain in my lower right back when I breathe.  The pain is getting worse.  I haven't taken any pain medication or for a cold."

## 2012-08-29 NOTE — MAU Provider Note (Signed)
History     CSN: [id number]  Arrival date and time: 08/29/12 1253   First Provider Initiated Contact with Patient 08/29/12 1435      Chief Complaint  Patient presents with  . Fever  . Nasal Congestion  . Back Pain   HPI  Sharon Mcconnell is a 32 y/o female,  G1P1, 2 months post partum, who presents to the MAU with fever, congestion, and back pain.  The patient's history is provided through interpreter services.  The patient states that the symptoms began on Sunday and have increased in severity.  The patient states that the pain has increased in severity and is located throughout her body, but most predominantly in the lower right side of her back. She also states that she is experiencing chills and sweats intermittently.  The patient denies any sick contacts and has not had any vaginal bleeding, discharge, or blood in her urine.  The patient denies any chest pain or SOB. She also denies dysuria or other urinary symptoms. The interview is somewhat limited by the language barrier.     OB History    Grav Para Term Preterm Abortions TAB SAB Ect Mult Living   1 1 1  0 0 0 0 0 0 1      Past Medical History  Diagnosis Date  . Hx of tuberculosis     treated  . Hx: UTI (urinary tract infection)     Past Surgical History  Procedure Date  . No past surgeries     History reviewed. No pertinent family history.  History  Substance Use Topics  . Smoking status: Never Smoker   . Smokeless tobacco: Never Used  . Alcohol Use: No    Allergies: No Known Allergies  Prescriptions prior to admission  Medication Sig Dispense Refill  . ibuprofen (ADVIL,MOTRIN) 600 MG tablet Take 1 tablet (600 mg total) by mouth every 6 (six) hours.  30 tablet  0  . Prenatal Vit-Fe Fumarate-FA (PRENATAL MULTIVITAMIN) TABS Take 1 tablet by mouth daily.      Marland Kitchen senna-docusate (SENOKOT-S) 8.6-50 MG per tablet Take 2 tablets by mouth at bedtime.  60 tablet  0    ROS All negative unless otherwise noted in  HPI Physical Exam   Blood pressure 115/67, pulse 103, temperature 98 F (36.7 C), temperature source Oral, resp. rate 18, last menstrual period 08/15/2012, SpO2 100.00%, currently breastfeeding.  Additional Temps: 102.1 F @ 1303 101.8 F @ 1605  Physical Exam General: well- developed, well-nourished ill appearing female in no acute distress. Patient appears uncomfortable.  HEENT: full ROM of neck, normocephalic, atraumatic Heart: Mildly tachycardic, rhythm normal, no extra sounds/murmurs Lungs: clear to ascultation bilaterally, no extra sounds ABD: Soft, non-tender. Bowel sounds present in all quadrants.  M/S: tenderness upon palpation of the lower spine.   GU: CVA tenderness noted bilaterally, more pronounced on the right side  MAU Course  Procedures None  MDM Discussed patient with Dr. Shawnie Pons. Recommends give LR IV bolus here and 1 g Rocephin IM. Patient should follow-up in 24 hours to determine need for additional intervention or if outpatient PO antibiotics are adequate.  Tylenol and Ibuprofen for pain and fever control at home. Increase oral hydration.   After fluids, tylenol and IM rocephin - patient reports feeling much better. Improved pain and improved tenderness noted on exam. Patient is still febrile, but appears and reports much less discomfort.  Assessment and Plan  A: Pyelonephritis  P: IV fluids and IM rocephin here today Discharge  home with Rx for  Patient to return to MAU in 24 hours for re-evaluation; May start PO antibiotics then if not admitted Patient encourage to increase oral hydration Recommended patient take Tylenol and Ibuprofen as directed at home for pain and fever control   Evalee Mutton 08/29/2012, 2:47 PM   I saw and evaluated this patient with the PA student. I have edited the note to reflect additional information from my encounter with the patient and discussion with Dr. Shawnie Pons.  I agree with the assessment and plan as written above.   Freddi Starr, PA-C 08/29/2012 4:32 PM

## 2012-08-30 ENCOUNTER — Inpatient Hospital Stay (HOSPITAL_COMMUNITY)
Admission: AD | Admit: 2012-08-30 | Discharge: 2012-08-30 | Disposition: A | Discharge status: Home / Self Care | Payer: Medicaid Other | Source: Ambulatory Visit | Attending: Obstetrics & Gynecology | Admitting: Obstetrics & Gynecology

## 2012-08-30 DIAGNOSIS — N12 Tubulo-interstitial nephritis, not specified as acute or chronic: Secondary | ICD-10-CM | POA: Insufficient documentation

## 2012-08-30 DIAGNOSIS — Z09 Encounter for follow-up examination after completed treatment for conditions other than malignant neoplasm: Secondary | ICD-10-CM | POA: Insufficient documentation

## 2012-08-30 DIAGNOSIS — O2390 Unspecified genitourinary tract infection in pregnancy, unspecified trimester: Secondary | ICD-10-CM | POA: Insufficient documentation

## 2012-08-30 NOTE — MAU Note (Signed)
pyelo follow up

## 2012-08-30 NOTE — MAU Note (Signed)
Feeling good though still has a little pain

## 2012-08-31 LAB — URINE CULTURE

## 2013-05-20 ENCOUNTER — Encounter (HOSPITAL_COMMUNITY): Payer: Self-pay | Admitting: *Deleted

## 2013-05-20 ENCOUNTER — Emergency Department (HOSPITAL_COMMUNITY)
Admission: EM | Admit: 2013-05-20 | Discharge: 2013-05-20 | Disposition: A | Discharge status: Home / Self Care | Payer: No Typology Code available for payment source | Source: Home / Self Care | Attending: Emergency Medicine | Admitting: Emergency Medicine

## 2013-05-20 DIAGNOSIS — Z3201 Encounter for pregnancy test, result positive: Secondary | ICD-10-CM

## 2013-05-20 DIAGNOSIS — O210 Mild hyperemesis gravidarum: Secondary | ICD-10-CM

## 2013-05-20 LAB — POCT URINALYSIS DIP (DEVICE)
Glucose, UA: NEGATIVE mg/dL
Leukocytes, UA: NEGATIVE
Nitrite: NEGATIVE
Specific Gravity, Urine: 1.015 (ref 1.005–1.030)
Urobilinogen, UA: 0.2 mg/dL (ref 0.0–1.0)

## 2013-05-20 LAB — POCT PREGNANCY, URINE: Preg Test, Ur: POSITIVE — AB

## 2013-05-20 MED ORDER — ONDANSETRON 4 MG PO TBDP: 4.0000 mg | ORAL_TABLET | Freq: Three times a day (TID) | ORAL | Status: DC | PRN

## 2013-05-20 NOTE — ED Notes (Signed)
Denies any travel within past 21 days.  Family member (translating) describes aching along trapezius area into neck and head, across low back, and rarely in all extremities.  Aches have been occuring x 2 months.  Does not have PCP.  Also requesting pregnancy test; has been having some nausea and dizziness.  At present, pain is along trapezius muscles.

## 2013-05-20 NOTE — ED Notes (Signed)
Translating family member had to leave; [employer] Interpreter provided via telephone.

## 2013-05-20 NOTE — ED Provider Notes (Signed)
CSN: [id number]     Arrival date & time 05/20/13  1129 History   First MD Initiated Contact with Patient 05/20/13 1217     Chief Complaint  Patient presents with  . Generalized Body Aches   (Consider location/radiation/quality/duration/timing/severity/associated sxs/prior Treatment) HPI Comments: 32 year old female who presents for evaluation of nausea with minimal vomiting. She says his been present for several months. She is also concerned that she may be pregnant, because her last mental period was on 04/07/2013. Additionally she notes dizziness.  Patient is a 32 y.o. female presenting with cramps. The history is provided by the patient. The history is limited by a language barrier. A language interpreter was used (an official [place] interpreter was used; additionally a family friend is taking this was present in the beginning of the interview).  Abdominal Cramping This is a chronic problem. The current episode started more than 1 week ago. The problem occurs constantly. The problem has not changed since onset.Associated symptoms include abdominal pain, headaches and shortness of breath. Pertinent negatives include no chest pain. Nothing aggravates the symptoms. Nothing relieves the symptoms. She has tried nothing for the symptoms.    Past Medical History  Diagnosis Date  . Hx of tuberculosis     treated; on 05/20/13 pt denies any knowledge of TB hx  . Hx: UTI (urinary tract infection)    Past Surgical History  Procedure Laterality Date  . No past surgeries     No family history on file. History  Substance Use Topics  . Smoking status: Never Smoker   . Smokeless tobacco: Never Used  . Alcohol Use: No   OB History   Grav Para Term Preterm Abortions TAB SAB Ect Mult Living   1 1 1  0 0 0 0 0 0 1     Review of Systems  Constitutional: Positive for activity change. Negative for fever, fatigue and unexpected weight change.  HENT: Positive for neck pain.   Eyes: Negative.    Respiratory: Positive for shortness of breath.   Cardiovascular: Negative for chest pain.  Gastrointestinal: Positive for nausea, vomiting and abdominal pain. Negative for diarrhea.  Genitourinary: Negative.   Musculoskeletal:       Positive for bilateral lower leg pain of several months duration as well as right shoulder pain of several months duration  Skin: Negative.   Neurological: Positive for headaches.    Allergies  Review of patient's allergies indicates no known allergies.  Home Medications   Current Outpatient Rx  Name  Route  Sig  Dispense  Refill  . ibuprofen (ADVIL,MOTRIN) 600 MG tablet   Oral   Take 1 tablet (600 mg total) by mouth every 6 (six) hours.   30 tablet   0   . ondansetron (ZOFRAN ODT) 4 MG disintegrating tablet   Oral   Take 1 tablet (4 mg total) by mouth every 8 (eight) hours as needed for nausea.   20 tablet   0   . Prenatal Vit-Fe Fumarate-FA (PRENATAL MULTIVITAMIN) TABS   Oral   Take 1 tablet by mouth daily.         Marland Kitchen senna-docusate (SENOKOT-S) 8.6-50 MG per tablet   Oral   Take 2 tablets by mouth at bedtime.   60 tablet   0    BP 110/69  Pulse 57  Temp(Src) 97.5 F (36.4 C) (Oral)  Resp 16  SpO2 100%  LMP 04/07/2013  Breastfeeding? Yes Physical Exam  Constitutional: She is oriented to person, place, and time. She  appears well-developed and well-nourished. No distress.  HENT:  Head: Normocephalic and atraumatic.  Right Ear: External ear normal.  Left Ear: External ear normal.  Mouth/Throat: No oropharyngeal exudate.  Eyes: Conjunctivae and EOM are normal. Pupils are equal, round, and reactive to light.  Neck: Normal range of motion. Neck supple.  Cardiovascular: Normal rate, regular rhythm and normal heart sounds.   Pulmonary/Chest: Effort normal and breath sounds normal. No respiratory distress.  Abdominal: Soft. Bowel sounds are normal. She exhibits no distension. There is no tenderness. There is no guarding.   Musculoskeletal: Normal range of motion. She exhibits no edema and no tenderness.  Neurological: She is alert and oriented to person, place, and time.  Skin: Skin is warm and dry. She is not diaphoretic.  Psychiatric: She has a normal mood and affect. Her behavior is normal.    ED Course  Procedures (including critical care time) Labs Review Labs Reviewed  POCT PREGNANCY, URINE - Abnormal; Notable for the following:    Preg Test, Ur POSITIVE (*)    All other components within normal limits  POCT URINALYSIS DIP (DEVICE) - Abnormal; Notable for the following:    Hgb urine dipstick TRACE (*)    All other components within normal limits   Imaging Review No results found.  MDM   1. Positive pregnancy test   2. Hyperemesis gravidarum    The patient has numerous symptoms that can be explained by pregnancy. She does not appear ill, therefore no further workup is indicated at this time. She was given a letter to take to the [facility] office in order to apply for pregnancy Medicaid. Thereafter she will make an appointment at the [facility] for her obstetric care. She was also given a prescription for Zofran to take as needed for nausea. She was instructed to continue her prenatal vitamins.    Garnetta Buddy, MD 05/20/13 [phone]

## 2013-05-20 NOTE — ED Notes (Signed)
Discharge instructions and Rx instructions reviewed w/ pt via Warden/ranger.  Pt instructed to go to [facility] for any problems or concerns with pregnancy, such as bleeding, abd pain, etc.  Letter provided per Dr. Clinton Sawyer so pt can sign up for Medicaid.  Informed pt to restart PNVs and she may only take acetaminophen during pregnancy for the muscle aches.

## 2013-05-20 NOTE — ED Provider Notes (Signed)
Medical screening examination/treatment/procedure(s) were performed by a resident physician and as supervising physician I was immediately available for consultation/collaboration.  Leslee Home, M.D.  Reuben Likes, MD 05/20/13 [phone]

## 2013-08-16 NOTE — L&D Delivery Note (Signed)
Delivery Note At 12:38 PM a viable female was delivered via Vaginal, Spontaneous Delivery (Presentation: Right Occiput Anterior) en caul w/ compound posterior (Rt) hand.  Vigorous infant placed directly on mom's abdomen for bonding/skin-to-skin. Cord allowed to stop pulsing, and was clamped x 2, and cut by myself- pt alone and didn't want to cut.  APGAR: 9, 9; weight: pending .   Placenta status: Intact, Spontaneous w/ scattered calcifications maternal side.  Cord: 3 vessels with the following complications: Short cord.   Anesthesia: Epidural  Episiotomy: n/a Lacerations: 2nd degree;Perineal Suture Repair: 3.0 vicryl Est. Blood Loss (mL): 400  Mom to postpartum.  Baby to Couplet care / Skin to Skin. Plans to breast and bottlefeed  [facility]on [facility] 01/12/2014, 1:27 PM

## 2013-08-16 NOTE — L&D Delivery Note (Signed)
`````  Attestation of Attending Supervision of Advanced Practitioner: Evaluation and management procedures were performed by the PA/NP/CNM/OB Fellow under my supervision/collaboration. Chart reviewed and agree with management and plan.  Kaylanie Capili V 01/13/2014 8:43 AM    

## 2013-09-20 ENCOUNTER — Other Ambulatory Visit (HOSPITAL_COMMUNITY): Payer: Self-pay | Admitting: Nurse Practitioner

## 2013-09-20 DIAGNOSIS — Z3689 Encounter for other specified antenatal screening: Secondary | ICD-10-CM

## 2013-09-20 LAB — OB RESULTS CONSOLE RPR: RPR: NONREACTIVE

## 2013-09-20 LAB — OB RESULTS CONSOLE HGB/HCT, BLOOD
HCT: 33 %
Hemoglobin: 11.3 g/dL

## 2013-09-20 LAB — OB RESULTS CONSOLE HIV ANTIBODY (ROUTINE TESTING): HIV: NONREACTIVE

## 2013-09-20 LAB — OB RESULTS CONSOLE ANTIBODY SCREEN: ANTIBODY SCREEN: NEGATIVE

## 2013-09-20 LAB — OB RESULTS CONSOLE PLATELET COUNT: Platelets: 234 10*3/uL

## 2013-09-20 LAB — OB RESULTS CONSOLE RUBELLA ANTIBODY, IGM: RUBELLA: IMMUNE

## 2013-09-20 LAB — CYSTIC FIBROSIS DIAGNOSTIC STUDY: CYSTIC FIBROSIS DNA GENOTYPE: NEGATIVE

## 2013-09-20 LAB — OB RESULTS CONSOLE VARICELLA ZOSTER ANTIBODY, IGG: VARICELLA IGG: IMMUNE

## 2013-09-20 LAB — OB RESULTS CONSOLE ABO/RH: RH TYPE: POSITIVE

## 2013-09-20 LAB — OB RESULTS CONSOLE HEPATITIS B SURFACE ANTIGEN: HEP B S AG: NEGATIVE

## 2013-09-20 LAB — GLUCOSE TOLERANCE, 1 HOUR: Glucose, 1 Hour GTT: 153

## 2013-10-01 ENCOUNTER — Encounter: Payer: Medicaid Other | Attending: Obstetrics & Gynecology | Admitting: *Deleted

## 2013-10-01 ENCOUNTER — Ambulatory Visit (INDEPENDENT_AMBULATORY_CARE_PROVIDER_SITE_OTHER): Payer: Medicaid Other | Admitting: Obstetrics & Gynecology

## 2013-10-01 VITALS — Ht 61.0 in

## 2013-10-01 DIAGNOSIS — O99810 Abnormal glucose complicating pregnancy: Secondary | ICD-10-CM | POA: Insufficient documentation

## 2013-10-01 DIAGNOSIS — Z713 Dietary counseling and surveillance: Secondary | ICD-10-CM | POA: Insufficient documentation

## 2013-10-01 DIAGNOSIS — O24419 Gestational diabetes mellitus in pregnancy, unspecified control: Secondary | ICD-10-CM

## 2013-10-01 MED ORDER — GLUCOSE BLOOD VI STRP: ORAL_STRIP | Status: DC

## 2013-10-01 MED ORDER — ACCU-CHEK FASTCLIX LANCETS MISC: 1.0000 | Freq: Four times a day (QID) | Status: DC

## 2013-10-01 NOTE — Progress Notes (Signed)
Nutrition note: GDM diet education Pt is a newly diagnosed GDM pt. Pt reports eating 2 meals & 2 snacks/d. Pt is taking PNV. Pt reports no N/V or heartburn. Pt received verbal & written education via chin interpreter about GDM diet. Discussed portion sizes. Pt agrees to follow GDM diet with 2-3 meals & 2-3 snacks/d and proper CHO/ protein combination. Pt has WIC & plans to BF. F/u in 2-4 wks Sharon RevealLaura Indira Sorenson, MS, RD, LDN, [facility]

## 2013-10-01 NOTE — Progress Notes (Signed)
Diabetes: Via phone interpreter   States when to check blood glucose levels  Demonstrates proper blood glucose monitoring techniques  Plan:  Aim for 2 Carb Choices per meal (30 grams) +/- 1 either way for breakfast Aim for 3 Carb Choices per meal (45 grams) +/- 1 either way from lunch and dinner Aim for 1-2 Carbs per snack  Blood glucose monitor given: Accu Chek Nano BG Monitoring Kit Lot # [id number] Exp: 11/14/14 Blood glucose reading: 174m/dl Order for testing supplies transmitted to [facility] Patient instructed to monitor glucose levels: FBS: 60 - <90 1 hour: <140 2 hour: <120  Patient received the following handouts:  Nutrition Diabetes and Pregnancy  Carbohydrate Counting List  Meal Planning worksheet  Patient will be seen for follow-up as needed.

## 2013-10-02 ENCOUNTER — Encounter: Payer: Self-pay | Admitting: *Deleted

## 2013-10-02 DIAGNOSIS — Z758 Other problems related to medical facilities and other health care: Secondary | ICD-10-CM | POA: Insufficient documentation

## 2013-10-02 DIAGNOSIS — Z843 Family history of consanguinity: Secondary | ICD-10-CM

## 2013-10-02 DIAGNOSIS — O0930 Supervision of pregnancy with insufficient antenatal care, unspecified trimester: Secondary | ICD-10-CM

## 2013-10-02 DIAGNOSIS — Z603 Acculturation difficulty: Secondary | ICD-10-CM

## 2013-10-02 DIAGNOSIS — Z789 Other specified health status: Secondary | ICD-10-CM

## 2013-10-02 DIAGNOSIS — Z7722 Contact with and (suspected) exposure to environmental tobacco smoke (acute) (chronic): Secondary | ICD-10-CM

## 2013-10-02 DIAGNOSIS — Z8611 Personal history of tuberculosis: Secondary | ICD-10-CM

## 2013-10-02 DIAGNOSIS — O24419 Gestational diabetes mellitus in pregnancy, unspecified control: Secondary | ICD-10-CM

## 2013-10-02 HISTORY — DX: Gestational diabetes mellitus in pregnancy, unspecified control: O24.419

## 2013-10-08 ENCOUNTER — Encounter: Payer: Medicaid Other | Attending: Obstetrics & Gynecology | Admitting: *Deleted

## 2013-10-08 ENCOUNTER — Ambulatory Visit (INDEPENDENT_AMBULATORY_CARE_PROVIDER_SITE_OTHER): Payer: Medicaid Other | Admitting: Obstetrics & Gynecology

## 2013-10-08 ENCOUNTER — Encounter: Payer: Self-pay | Admitting: *Deleted

## 2013-10-08 ENCOUNTER — Encounter: Payer: Self-pay | Admitting: Obstetrics & Gynecology

## 2013-10-08 VITALS — BP 105/70 | Temp 98.8°F | Wt 148.1 lb

## 2013-10-08 DIAGNOSIS — O99810 Abnormal glucose complicating pregnancy: Secondary | ICD-10-CM

## 2013-10-08 DIAGNOSIS — Z713 Dietary counseling and surveillance: Secondary | ICD-10-CM | POA: Insufficient documentation

## 2013-10-08 DIAGNOSIS — O0990 Supervision of high risk pregnancy, unspecified, unspecified trimester: Secondary | ICD-10-CM | POA: Insufficient documentation

## 2013-10-08 LAB — POCT URINALYSIS DIP (DEVICE)
BILIRUBIN URINE: NEGATIVE
GLUCOSE, UA: NEGATIVE mg/dL
KETONES UR: NEGATIVE mg/dL
LEUKOCYTES UA: NEGATIVE
NITRITE: NEGATIVE
PH: 7 (ref 5.0–8.0)
Protein, ur: NEGATIVE mg/dL
Specific Gravity, Urine: 1.01 (ref 1.005–1.030)
Urobilinogen, UA: 0.2 mg/dL (ref 0.0–1.0)

## 2013-10-08 MED ORDER — GLUCOSE BLOOD VI STRP: ORAL_STRIP | Status: DC

## 2013-10-08 MED ORDER — ACCU-CHEK FASTCLIX LANCETS MISC: 1.0000 | Freq: Four times a day (QID) | Status: DC

## 2013-10-08 MED ORDER — TETANUS-DIPHTH-ACELL PERTUSSIS 5-2.5-18.5 LF-MCG/0.5 IM SUSP: 0.5000 mL | Freq: Once | INTRAMUSCULAR | Status: DC

## 2013-10-08 NOTE — Progress Notes (Signed)
[facility], has started testing BG and normal, continue dietary management  Subjective:    Sharon Mcconnell is a G2P1001 3763w2d being seen today for her first obstetrical visit.  Her obstetrical history is significant for gestational diabetes Dx at [facility]. Patient does intend to breast feed. Pregnancy history fully reviewed.  Patient reports no complaints.  Filed Vitals:   10/08/13 0917  BP: 105/70  Temp: 98.8 F (37.1 C)  Weight: 148 lb 1.6 oz (67.178 kg)    HISTORY: OB History  Gravida Para Term Preterm AB SAB TAB Ectopic Multiple Living  2 1 1  0 0 0 0 0 0 1    # Outcome Date GA Lbr Len/2nd Weight Sex Delivery Anes PTL Lv  2 CUR           1 TRM 06/07/12 3663w5d 04:52 / 03:23 6 lb 9.5 oz (2.991 kg) M VAC EPI  Y     Past Medical History  Diagnosis Date  . Hx of tuberculosis     treated; on 05/20/13 pt denies any knowledge of TB hx  . Hx: UTI (urinary tract infection)    Past Surgical History  Procedure Laterality Date  . No past surgeries     History reviewed. No pertinent family history.   Exam    Uterus:     Pelvic Exam:    Assessment:    Pregnancy: G2P1001 Patient Active Problem List   Diagnosis Date Noted  . Supervision of high-risk pregnancy 10/08/2013  . Gestational diabetes 10/02/2013  . Language barrier 10/02/2013  . 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 Hx of tuberculosis 10/02/2013  . Late prenatal care 10/02/2013  . Exposure to second hand smoke 10/02/2013  . Vacuum extraction, delivered, current hospitalization 06/09/2012  . Oligohydramnios antepartum 06/06/2012  . Fetal bilateral pyelectasis, antepartum 06/06/2012        Plan:      Prenatal vitamins. Problem list reviewed and updated. Marland Kitchen.  Ultrasound discussed; fetal survey: ordered.  Follow up in 2 weeks. 50% of 30 min visit spent on counseling and coordination of care.      ARNOLD,JAMES 10/08/2013

## 2013-10-08 NOTE — Progress Notes (Signed)
Pulse- 85 New ob packet given Weight gain 25-35lbs Tdap vaccine consented

## 2013-10-08 NOTE — Progress Notes (Signed)
Diabetes: Via phone interpreter Patient did not follow up with obtaining testing supplies at pharmacy. i have provided her with hard copy prescriptions to take to pharmacy. Documentation review provided.

## 2013-10-08 NOTE — Patient Instructions (Signed)
Gestational Diabetes Mellitus Gestational diabetes mellitus, often simply referred to as gestational diabetes, is a type of diabetes that some women develop during pregnancy. In gestational diabetes, the pancreas does not make enough insulin (a hormone), the cells are less responsive to the insulin that is made (insulin resistance), or both.Normally, insulin moves sugars from food into the tissue cells. The tissue cells use the sugars for energy. The lack of insulin or the lack of normal response to insulin causes excess sugars to build up in the blood instead of going into the tissue cells. As a result, high blood sugar (hyperglycemia) develops. The effect of high sugar (glucose) levels can cause many complications.  RISK FACTORS You have an increased chance of developing gestational diabetes if you have a family history of diabetes and also have one or more of the following risk factors:  A body mass index over 30 (obesity).  A previous pregnancy with gestational diabetes.  An older age at the time of pregnancy. If blood glucose levels are kept in the normal range during pregnancy, women can have a healthy pregnancy. If your blood glucose levels are not well controlled, there may be risks to you, your unborn baby (fetus), your labor and delivery, or your newborn baby.  SYMPTOMS  If symptoms are experienced, they are much like symptoms you would normally expect during pregnancy. The symptoms of gestational diabetes include:   Increased thirst (polydipsia).  Increased urination (polyuria).  Increased urination during the night (nocturia).  Weight loss. This weight loss may be rapid.  Frequent, recurring infections.  Tiredness (fatigue).  Weakness.  Vision changes, such as blurred vision.  Fruity smell to your breath.  Abdominal pain. DIAGNOSIS Diabetes is diagnosed when blood glucose levels are increased. Your blood glucose level may be checked by one or more of the following  blood tests:  A fasting blood glucose test. You will not be allowed to eat for at least 8 hours before a blood sample is taken.  A random blood glucose test. Your blood glucose is checked at any time of the day regardless of when you ate.  A hemoglobin A1c blood glucose test. A hemoglobin A1c test provides information about blood glucose control over the previous 3 months.  An oral glucose tolerance test (OGTT). Your blood glucose is measured after you have not eaten (fasted) for 1 3 hours and then after you drink a glucose-containing beverage. Since the hormones that cause insulin resistance are highest at about 24 28 weeks of a pregnancy, an OGTT is usually performed during that time. If you have risk factors for gestational diabetes, your caregiver may test you for gestational diabetes earlier than 24 weeks of pregnancy. TREATMENT   You will need to take diabetes medicine or insulin daily to keep blood glucose levels in the desired range.  You will need to match insulin dosing with exercise and healthy food choices. The treatment goal is to maintain the before meal (preprandial), bedtime, and overnight blood glucose level at 60 99 mg/dL during pregnancy. The treatment goal is to further maintain peak after meal blood sugar (postprandial glucose) level at 100 140 mg/dL. HOME CARE INSTRUCTIONS   Have your hemoglobin A1c level checked twice a year.  Perform daily blood glucose monitoring as directed by your caregiver. It is common to perform frequent blood glucose monitoring.  Monitor urine ketones when you are ill and as directed by your caregiver.  Take your diabetes medicine and insulin as directed by your caregiver to maintain   your blood glucose level in the desired range.  Never run out of diabetes medicine or insulin. It is needed every day.  Adjust insulin based on your intake of carbohydrates. Carbohydrates can raise blood glucose levels but need to be included in your diet.  Carbohydrates provide vitamins, minerals, and fiber which are an essential part of a healthy diet. Carbohydrates are found in fruits, vegetables, whole grains, dairy products, legumes, and foods containing added sugars.    Eat healthy foods. Alternate 3 meals with 3 snacks.  Maintain a healthy weight gain. The usual total expected weight gain varies according to your prepregnancy body mass index (BMI).  Carry a medical alert card or wear your medical alert jewelry.  Carry a 15 gram carbohydrate snack with you at all times to treat low blood glucose (hypoglycemia). Some examples of 15 gram carbohydrate snacks include:  Glucose tablets, 3 or 4   Glucose gel, 15 gram tube  Raisins, 2 tablespoons (24 g)  Jelly beans, 6  Animal crackers, 8  Fruit juice, regular soda, or low fat milk, 4 ounces (120 mL)  Gummy treats, 9    Recognize hypoglycemia. Hypoglycemia during pregnancy occurs with blood glucose levels of 60 mg/dL and below. The risk for hypoglycemia increases when fasting or skipping meals, during or after intense exercise, and during sleep. Hypoglycemia symptoms can include:  Tremors or shakes.  Decreased ability to concentrate.  Sweating.  Increased heart rate.  Headache.  Dry mouth.  Hunger.  Irritability.  Anxiety.  Restless sleep.  Altered speech or coordination.  Confusion.  Treat hypoglycemia promptly. If you are alert and able to safely swallow, follow the 15:15 rule:  Take 15 20 grams of rapid-acting glucose or carbohydrate. Rapid-acting options include glucose gel, glucose tablets, or 4 ounces (120 mL) of fruit juice, regular soda, or low fat milk.  Check your blood glucose level 15 minutes after taking the glucose.   Take 15 20 grams more of glucose if the repeat blood glucose level is still 70 mg/dL or below.  Eat a meal or snack within 1 hour once blood glucose levels return to normal.  Be alert to polyuria and polydipsia which are early  signs of hyperglycemia. An early awareness of hyperglycemia allows for prompt treatment. Treat hyperglycemia as directed by your caregiver.  Engage in at least 30 minutes of physical activity a day or as directed by your caregiver. Ten minutes of physical activity timed 30 minutes after each meal is encouraged to control postprandial blood glucose levels.  Adjust your insulin dosing and food intake as needed if you start a new exercise or sport.  Follow your sick day plan at any time you are unable to eat or drink as usual.  Avoid tobacco and alcohol use.  Follow up with your caregiver regularly.  Follow the advice of your caregiver regarding your prenatal and post-delivery (postpartum) appointments, meal planning, exercise, medicines, vitamins, blood tests, other medical tests, and physical activities.  Perform daily skin and foot care. Examine your skin and feet daily for cuts, bruises, redness, nail problems, bleeding, blisters, or sores.  Brush your teeth and gums at least twice a day and floss at least once a day. Follow up with your dentist regularly.  Schedule an eye exam during the first trimester of your pregnancy or as directed by your caregiver.  Share your diabetes management plan with your workplace or school.  Stay up-to-date with immunizations.  Learn to manage stress.  Obtain ongoing diabetes education and   support as needed. SEEK MEDICAL CARE IF:   You are unable to eat food or drink fluids for more than 6 hours.  You have nausea and vomiting for more than 6 hours.  You have a blood glucose level of 200 mg/dL and you have ketones in your urine.  There is a change in mental status.  You develop vision problems.  You have a persistent headache.  You have upper abdominal pain or discomfort.  You develop an additional serious illness.  You have diarrhea for more than 6 hours.  You have been sick or have had a fever for a couple of days and are not getting  better. SEEK IMMEDIATE MEDICAL CARE IF:   You have difficulty breathing.  You no longer feel the baby moving.  You are bleeding or have discharge from your vagina.  You start having premature contractions or labor. MAKE SURE YOU:  Understand these instructions.  Will watch your condition.  Will get help right away if you are not doing well or get worse. Document Released: 11/08/2000 Document Revised: 11/27/2012 Document Reviewed: 02/29/2012 Exit[employer] Patient Information 2014 [employer].  

## 2013-10-09 LAB — CULTURE, OB URINE
COLONY COUNT: NO GROWTH
ORGANISM ID, BACTERIA: NO GROWTH

## 2013-10-11 ENCOUNTER — Ambulatory Visit (HOSPITAL_COMMUNITY)
Admission: RE | Admit: 2013-10-11 | Discharge: 2013-10-11 | Disposition: A | Discharge status: Home / Self Care | Payer: Medicaid Other | Source: Ambulatory Visit | Attending: Nurse Practitioner | Admitting: Nurse Practitioner

## 2013-10-11 DIAGNOSIS — O0930 Supervision of pregnancy with insufficient antenatal care, unspecified trimester: Secondary | ICD-10-CM | POA: Insufficient documentation

## 2013-10-11 DIAGNOSIS — Z3689 Encounter for other specified antenatal screening: Secondary | ICD-10-CM | POA: Insufficient documentation

## 2013-10-11 DIAGNOSIS — O99810 Abnormal glucose complicating pregnancy: Secondary | ICD-10-CM | POA: Insufficient documentation

## 2013-10-29 ENCOUNTER — Encounter: Payer: Self-pay | Admitting: Obstetrics & Gynecology

## 2013-10-29 ENCOUNTER — Ambulatory Visit (INDEPENDENT_AMBULATORY_CARE_PROVIDER_SITE_OTHER): Payer: Medicaid Other | Admitting: Obstetrics & Gynecology

## 2013-10-29 VITALS — BP 99/67 | Temp 96.7°F | Wt 147.8 lb

## 2013-10-29 DIAGNOSIS — O0990 Supervision of high risk pregnancy, unspecified, unspecified trimester: Secondary | ICD-10-CM

## 2013-10-29 DIAGNOSIS — Z23 Encounter for immunization: Secondary | ICD-10-CM

## 2013-10-29 DIAGNOSIS — O99810 Abnormal glucose complicating pregnancy: Secondary | ICD-10-CM

## 2013-10-29 DIAGNOSIS — O24419 Gestational diabetes mellitus in pregnancy, unspecified control: Secondary | ICD-10-CM

## 2013-10-29 LAB — CBC
HCT: 31.8 % — ABNORMAL LOW (ref 36.0–46.0)
HEMOGLOBIN: 10.5 g/dL — AB (ref 12.0–15.0)
MCH: 25.9 pg — ABNORMAL LOW (ref 26.0–34.0)
MCHC: 33 g/dL (ref 30.0–36.0)
MCV: 78.5 fL (ref 78.0–100.0)
Platelets: 231 10*3/uL (ref 150–400)
RBC: 4.05 MIL/uL (ref 3.87–5.11)
RDW: 13.8 % (ref 11.5–15.5)
WBC: 6.3 10*3/uL (ref 4.0–10.5)

## 2013-10-29 LAB — POCT URINALYSIS DIP (DEVICE)
BILIRUBIN URINE: NEGATIVE
GLUCOSE, UA: NEGATIVE mg/dL
Hgb urine dipstick: NEGATIVE
Ketones, ur: NEGATIVE mg/dL
Leukocytes, UA: NEGATIVE
NITRITE: NEGATIVE
Protein, ur: NEGATIVE mg/dL
SPECIFIC GRAVITY, URINE: 1.01 (ref 1.005–1.030)
UROBILINOGEN UA: 0.2 mg/dL (ref 0.0–1.0)
pH: 6.5 (ref 5.0–8.0)

## 2013-10-29 MED ORDER — ACCU-CHEK AVIVA PLUS W/DEVICE KIT: 1.0000 | PACK | Freq: Four times a day (QID) | Status: AC

## 2013-10-29 MED ORDER — PRENATAL MULTIVITAMIN CH: 1.0000 | ORAL_TABLET | Freq: Every day | ORAL | Status: DC

## 2013-10-29 NOTE — Progress Notes (Signed)
P= 81 CBC, RPR to be collected today as pt. Left without getting labs last visit.  Pap with cultures today. Tdap today.  Requests refill of PNV.

## 2013-10-29 NOTE — Progress Notes (Signed)
fastings are mostly 80s with a few 90s.  Pp highest is 140 but most are 100s-120.  Pt controlled with diet.

## 2013-10-30 LAB — RPR

## 2013-11-12 ENCOUNTER — Ambulatory Visit (INDEPENDENT_AMBULATORY_CARE_PROVIDER_SITE_OTHER): Payer: Medicaid Other | Admitting: Family Medicine

## 2013-11-12 ENCOUNTER — Encounter: Payer: Self-pay | Admitting: Family Medicine

## 2013-11-12 VITALS — BP 104/74 | Temp 96.8°F | Wt 147.8 lb

## 2013-11-12 DIAGNOSIS — O99810 Abnormal glucose complicating pregnancy: Secondary | ICD-10-CM

## 2013-11-12 DIAGNOSIS — O24419 Gestational diabetes mellitus in pregnancy, unspecified control: Secondary | ICD-10-CM

## 2013-11-12 DIAGNOSIS — O0990 Supervision of high risk pregnancy, unspecified, unspecified trimester: Secondary | ICD-10-CM

## 2013-11-12 LAB — POCT URINALYSIS DIP (DEVICE)
BILIRUBIN URINE: NEGATIVE
GLUCOSE, UA: NEGATIVE mg/dL
HGB URINE DIPSTICK: NEGATIVE
KETONES UR: NEGATIVE mg/dL
LEUKOCYTES UA: NEGATIVE
NITRITE: NEGATIVE
Protein, ur: NEGATIVE mg/dL
Specific Gravity, Urine: 1.02 (ref 1.005–1.030)
Urobilinogen, UA: 0.2 mg/dL (ref 0.0–1.0)
pH: 7 (ref 5.0–8.0)

## 2013-11-12 MED ORDER — PRENATAL MULTIVITAMIN CH: 1.0000 | ORAL_TABLET | Freq: Every day | ORAL | Status: AC

## 2013-11-12 NOTE — Progress Notes (Signed)
Fasting 105/81/89/86/84/85/93/77/73/80/97/98/80/91 2hB 105/80/99/97/99/73/99/9088/87/99/99/96 2hL 105/110/98/107/129/112/102/96/130/115/105/100/99/97/110/117/137/102/100 2hD 117/120/115/130/134/102/115/105/123/127/117/93/115  Glucoses at goal on diet. No meds at this time  +FM, no lof, no vb, no ctx. No complaints.  Needs more PNV - refilled  Luwana Bieler is a 33 y.o. G2P1001 at 1091w2d by L=26 here for ROB visit.  Discussed with Patient:  -Plans to breast feed.  All questions answered. -Continue prenatal vitamins. - Routine precautions discussed (depression, infection s/s).   Patient provided with all pertinent phone numbers for emergencies. - RTC for any VB, regular, painful cramps/ctxs occurring at a rate of >2/10 min, fever (100.5 or higher), n/v/d, any pain that is unresolving or worsening, LOF, decreased fetal movement, CP, SOB, edema - RTC in 2 weeks for next appt.  Problems: Patient Active Problem List   Diagnosis Date Noted  . Supervision of high-risk pregnancy 10/08/2013  . Gestational diabetes 10/02/2013  . Language barrier 10/02/2013  . 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 Hx of tuberculosis 10/02/2013  . Late prenatal care 10/02/2013  . Exposure to second hand smoke 10/02/2013    To Do:   [ ]  Vaccines.Tdap: recd [ x] BCM: nexplanon [ x] Readiness: baby has a place to sleep, car seat, other baby necessities.  Edu: [x ] PTL precautions; [ ]  BF class; [ ]  childbirth class; [ ]   BF counseling;

## 2013-11-12 NOTE — Progress Notes (Signed)
P-83 

## 2013-11-12 NOTE — Patient Instructions (Signed)
Third Trimester of Pregnancy  The third trimester is from week 29 through week 42, months 7 through 9. The third trimester is a time when the fetus is growing rapidly. At the end of the ninth month, the fetus is about 20 inches in length and weighs 6 10 pounds.   BODY CHANGES  Your body goes through many changes during pregnancy. The changes vary from woman to woman.    Your weight will continue to increase. You can expect to gain 25 35 pounds (11 16 kg) by the end of the pregnancy.   You may begin to get stretch marks on your hips, abdomen, and breasts.   You may urinate more often because the fetus is moving lower into your pelvis and pressing on your bladder.   You may develop or continue to have heartburn as a result of your pregnancy.   You may develop constipation because certain hormones are causing the muscles that push waste through your intestines to slow down.   You may develop hemorrhoids or swollen, bulging veins (varicose veins).   You may have pelvic pain because of the weight gain and pregnancy hormones relaxing your joints between the bones in your pelvis. Back aches may result from over exertion of the muscles supporting your posture.   Your breasts will continue to grow and be tender. A yellow discharge may leak from your breasts called colostrum.   Your belly button may stick out.   You may feel short of breath because of your expanding uterus.   You may notice the fetus "dropping," or moving lower in your abdomen.   You may have a bloody mucus discharge. This usually occurs a few days to a week before labor begins.   Your cervix becomes thin and soft (effaced) near your due date.  WHAT TO EXPECT AT YOUR PRENATAL EXAMS   You will have prenatal exams every 2 weeks until week 36. Then, you will have weekly prenatal exams. During a routine prenatal visit:   You will be weighed to make sure you and the fetus are growing normally.   Your blood pressure is taken.   Your abdomen will be  measured to track your baby's growth.   The fetal heartbeat will be listened to.   Any test results from the previous visit will be discussed.   You may have a cervical check near your due date to see if you have effaced.  At around 36 weeks, your caregiver will check your cervix. At the same time, your caregiver will also perform a test on the secretions of the vaginal tissue. This test is to determine if a type of bacteria, Group B streptococcus, is present. Your caregiver will explain this further.  Your caregiver may ask you:   What your birth plan is.   How you are feeling.   If you are feeling the baby move.   If you have had any abnormal symptoms, such as leaking fluid, bleeding, severe headaches, or abdominal cramping.   If you have any questions.  Other tests or screenings that may be performed during your third trimester include:   Blood tests that check for low iron levels (anemia).   Fetal testing to check the health, activity level, and growth of the fetus. Testing is done if you have certain medical conditions or if there are problems during the pregnancy.  FALSE LABOR  You may feel small, irregular contractions that eventually go away. These are called Braxton Hicks contractions, or   false labor. Contractions may last for hours, days, or even weeks before true labor sets in. If contractions come at regular intervals, intensify, or become painful, it is best to be seen by your caregiver.   SIGNS OF LABOR    Menstrual-like cramps.   Contractions that are 5 minutes apart or less.   Contractions that start on the top of the uterus and spread down to the lower abdomen and back.   A sense of increased pelvic pressure or back pain.   A watery or bloody mucus discharge that comes from the vagina.  If you have any of these signs before the 37th week of pregnancy, call your caregiver right away. You need to go to the hospital to get checked immediately.  HOME CARE INSTRUCTIONS    Avoid all  smoking, herbs, alcohol, and unprescribed drugs. These chemicals affect the formation and growth of the baby.   Follow your caregiver's instructions regarding medicine use. There are medicines that are either safe or unsafe to take during pregnancy.   Exercise only as directed by your caregiver. Experiencing uterine cramps is a good sign to stop exercising.   Continue to eat regular, healthy meals.   Wear a good support bra for breast tenderness.   Do not use hot tubs, steam rooms, or saunas.   Wear your seat belt at all times when driving.   Avoid raw meat, uncooked cheese, cat litter boxes, and soil used by cats. These carry germs that can cause birth defects in the baby.   Take your prenatal vitamins.   Try taking a stool softener (if your caregiver approves) if you develop constipation. Eat more high-fiber foods, such as fresh vegetables or fruit and whole grains. Drink plenty of fluids to keep your urine clear or pale yellow.   Take warm sitz baths to soothe any pain or discomfort caused by hemorrhoids. Use hemorrhoid cream if your caregiver approves.   If you develop varicose veins, wear support hose. Elevate your feet for 15 minutes, 3 4 times a day. Limit salt in your diet.   Avoid heavy lifting, wear low heal shoes, and practice good posture.   Rest a lot with your legs elevated if you have leg cramps or low back pain.   Visit your dentist if you have not gone during your pregnancy. Use a soft toothbrush to brush your teeth and be gentle when you floss.   A sexual relationship may be continued unless your caregiver directs you otherwise.   Do not travel far distances unless it is absolutely necessary and only with the approval of your caregiver.   Take prenatal classes to understand, practice, and ask questions about the labor and delivery.   Make a trial run to the hospital.   Pack your hospital bag.   Prepare the baby's nursery.   Continue to go to all your prenatal visits as directed  by your caregiver.  SEEK MEDICAL CARE IF:   You are unsure if you are in labor or if your water has broken.   You have dizziness.   You have mild pelvic cramps, pelvic pressure, or nagging pain in your abdominal area.   You have persistent nausea, vomiting, or diarrhea.   You have a bad smelling vaginal discharge.   You have pain with urination.  SEEK IMMEDIATE MEDICAL CARE IF:    You have a fever.   You are leaking fluid from your vagina.   You have spotting or bleeding from your vagina.     You have severe abdominal cramping or pain.   You have rapid weight loss or gain.   You have shortness of breath with chest pain.   You notice sudden or extreme swelling of your face, hands, ankles, feet, or legs.   You have not felt your baby move in over an hour.   You have severe headaches that do not go away with medicine.   You have vision changes.  Document Released: 07/27/2001 Document Revised: 04/04/2013 Document Reviewed: 10/03/2012  Exit[employer] Patient Information 2014 [employer].

## 2013-11-26 ENCOUNTER — Ambulatory Visit (INDEPENDENT_AMBULATORY_CARE_PROVIDER_SITE_OTHER): Payer: Medicaid Other | Admitting: Family Medicine

## 2013-11-26 VITALS — BP 102/68 | Temp 96.7°F | Wt 149.2 lb

## 2013-11-26 DIAGNOSIS — Z8611 Personal history of tuberculosis: Secondary | ICD-10-CM

## 2013-11-26 DIAGNOSIS — O99810 Abnormal glucose complicating pregnancy: Secondary | ICD-10-CM

## 2013-11-26 DIAGNOSIS — O24419 Gestational diabetes mellitus in pregnancy, unspecified control: Secondary | ICD-10-CM

## 2013-11-26 DIAGNOSIS — O0990 Supervision of high risk pregnancy, unspecified, unspecified trimester: Secondary | ICD-10-CM

## 2013-11-26 LAB — POCT URINALYSIS DIP (DEVICE)
Bilirubin Urine: NEGATIVE
Glucose, UA: NEGATIVE mg/dL
HGB URINE DIPSTICK: NEGATIVE
Ketones, ur: NEGATIVE mg/dL
NITRITE: NEGATIVE
PH: 7 (ref 5.0–8.0)
PROTEIN: NEGATIVE mg/dL
SPECIFIC GRAVITY, URINE: 1.015 (ref 1.005–1.030)
UROBILINOGEN UA: 0.2 mg/dL (ref 0.0–1.0)

## 2013-11-26 NOTE — Progress Notes (Signed)
P-84  

## 2013-11-26 NOTE — Progress Notes (Signed)
33 yo G2P1001 @ 6257w2d here for ROBV with A1DM.  - no ctx, lof, vb. +FM DM- 79-97 (1 greater than 91) fasting and 2hr pp 83-127  O: see flowsheet  A/P - a1DM- doing well. Better than last time. Gave up sweets and now much better controlled. Cont diet - will need growth US at 38 weeks to be scheduled next visit -PTL precautions discussed - f/u in 2 weeks

## 2013-11-26 NOTE — Patient Instructions (Signed)
Third Trimester of Pregnancy  The third trimester is from week 29 through week 42, months 7 through 9. The third trimester is a time when the fetus is growing rapidly. At the end of the ninth month, the fetus is about 20 inches in length and weighs 6 10 pounds.   BODY CHANGES  Your body goes through many changes during pregnancy. The changes vary from woman to woman.    Your weight will continue to increase. You can expect to gain 25 35 pounds (11 16 kg) by the end of the pregnancy.   You may begin to get stretch marks on your hips, abdomen, and breasts.   You may urinate more often because the fetus is moving lower into your pelvis and pressing on your bladder.   You may develop or continue to have heartburn as a result of your pregnancy.   You may develop constipation because certain hormones are causing the muscles that push waste through your intestines to slow down.   You may develop hemorrhoids or swollen, bulging veins (varicose veins).   You may have pelvic pain because of the weight gain and pregnancy hormones relaxing your joints between the bones in your pelvis. Back aches may result from over exertion of the muscles supporting your posture.   Your breasts will continue to grow and be tender. A yellow discharge may leak from your breasts called colostrum.   Your belly button may stick out.   You may feel short of breath because of your expanding uterus.   You may notice the fetus "dropping," or moving lower in your abdomen.   You may have a bloody mucus discharge. This usually occurs a few days to a week before labor begins.   Your cervix becomes thin and soft (effaced) near your due date.  WHAT TO EXPECT AT YOUR PRENATAL EXAMS   You will have prenatal exams every 2 weeks until week 36. Then, you will have weekly prenatal exams. During a routine prenatal visit:   You will be weighed to make sure you and the fetus are growing normally.   Your blood pressure is taken.   Your abdomen will be  measured to track your baby's growth.   The fetal heartbeat will be listened to.   Any test results from the previous visit will be discussed.   You may have a cervical check near your due date to see if you have effaced.  At around 36 weeks, your caregiver will check your cervix. At the same time, your caregiver will also perform a test on the secretions of the vaginal tissue. This test is to determine if a type of bacteria, Group B streptococcus, is present. Your caregiver will explain this further.  Your caregiver may ask you:   What your birth plan is.   How you are feeling.   If you are feeling the baby move.   If you have had any abnormal symptoms, such as leaking fluid, bleeding, severe headaches, or abdominal cramping.   If you have any questions.  Other tests or screenings that may be performed during your third trimester include:   Blood tests that check for low iron levels (anemia).   Fetal testing to check the health, activity level, and growth of the fetus. Testing is done if you have certain medical conditions or if there are problems during the pregnancy.  FALSE LABOR  You may feel small, irregular contractions that eventually go away. These are called Braxton Hicks contractions, or   false labor. Contractions may last for hours, days, or even weeks before true labor sets in. If contractions come at regular intervals, intensify, or become painful, it is best to be seen by your caregiver.   SIGNS OF LABOR    Menstrual-like cramps.   Contractions that are 5 minutes apart or less.   Contractions that start on the top of the uterus and spread down to the lower abdomen and back.   A sense of increased pelvic pressure or back pain.   A watery or bloody mucus discharge that comes from the vagina.  If you have any of these signs before the 37th week of pregnancy, call your caregiver right away. You need to go to the hospital to get checked immediately.  HOME CARE INSTRUCTIONS    Avoid all  smoking, herbs, alcohol, and unprescribed drugs. These chemicals affect the formation and growth of the baby.   Follow your caregiver's instructions regarding medicine use. There are medicines that are either safe or unsafe to take during pregnancy.   Exercise only as directed by your caregiver. Experiencing uterine cramps is a good sign to stop exercising.   Continue to eat regular, healthy meals.   Wear a good support bra for breast tenderness.   Do not use hot tubs, steam rooms, or saunas.   Wear your seat belt at all times when driving.   Avoid raw meat, uncooked cheese, cat litter boxes, and soil used by cats. These carry germs that can cause birth defects in the baby.   Take your prenatal vitamins.   Try taking a stool softener (if your caregiver approves) if you develop constipation. Eat more high-fiber foods, such as fresh vegetables or fruit and whole grains. Drink plenty of fluids to keep your urine clear or pale yellow.   Take warm sitz baths to soothe any pain or discomfort caused by hemorrhoids. Use hemorrhoid cream if your caregiver approves.   If you develop varicose veins, wear support hose. Elevate your feet for 15 minutes, 3 4 times a day. Limit salt in your diet.   Avoid heavy lifting, wear low heal shoes, and practice good posture.   Rest a lot with your legs elevated if you have leg cramps or low back pain.   Visit your dentist if you have not gone during your pregnancy. Use a soft toothbrush to brush your teeth and be gentle when you floss.   A sexual relationship may be continued unless your caregiver directs you otherwise.   Do not travel far distances unless it is absolutely necessary and only with the approval of your caregiver.   Take prenatal classes to understand, practice, and ask questions about the labor and delivery.   Make a trial run to the hospital.   Pack your hospital bag.   Prepare the baby's nursery.   Continue to go to all your prenatal visits as directed  by your caregiver.  SEEK MEDICAL CARE IF:   You are unsure if you are in labor or if your water has broken.   You have dizziness.   You have mild pelvic cramps, pelvic pressure, or nagging pain in your abdominal area.   You have persistent nausea, vomiting, or diarrhea.   You have a bad smelling vaginal discharge.   You have pain with urination.  SEEK IMMEDIATE MEDICAL CARE IF:    You have a fever.   You are leaking fluid from your vagina.   You have spotting or bleeding from your vagina.     You have severe abdominal cramping or pain.   You have rapid weight loss or gain.   You have shortness of breath with chest pain.   You notice sudden or extreme swelling of your face, hands, ankles, feet, or legs.   You have not felt your baby move in over an hour.   You have severe headaches that do not go away with medicine.   You have vision changes.  Document Released: 07/27/2001 Document Revised: 04/04/2013 Document Reviewed: 10/03/2012  Exit[employer] Patient Information 2014 [employer].

## 2013-12-10 ENCOUNTER — Ambulatory Visit (INDEPENDENT_AMBULATORY_CARE_PROVIDER_SITE_OTHER): Payer: Medicaid Other | Admitting: Obstetrics & Gynecology

## 2013-12-10 VITALS — BP 110/74 | Temp 97.1°F | Wt 150.2 lb

## 2013-12-10 DIAGNOSIS — O99810 Abnormal glucose complicating pregnancy: Secondary | ICD-10-CM

## 2013-12-10 DIAGNOSIS — O24419 Gestational diabetes mellitus in pregnancy, unspecified control: Secondary | ICD-10-CM

## 2013-12-10 LAB — POCT URINALYSIS DIP (DEVICE)
BILIRUBIN URINE: NEGATIVE
Glucose, UA: NEGATIVE mg/dL
HGB URINE DIPSTICK: NEGATIVE
Ketones, ur: NEGATIVE mg/dL
NITRITE: NEGATIVE
PH: 7 (ref 5.0–8.0)
PROTEIN: NEGATIVE mg/dL
Specific Gravity, Urine: <=1.005 (ref 1.005–1.030)
UROBILINOGEN UA: 0.2 mg/dL (ref 0.0–1.0)

## 2013-12-10 NOTE — Progress Notes (Signed)
CBG are in range.  No meds for diabetes.   Needs cultures next week. Size less than dates.  Will get US.

## 2013-12-10 NOTE — Progress Notes (Signed)
p=102 

## 2013-12-10 NOTE — Progress Notes (Signed)
U/S scheduled 12/12/13 at 4 pm.

## 2013-12-12 ENCOUNTER — Ambulatory Visit (HOSPITAL_COMMUNITY)
Admission: RE | Admit: 2013-12-12 | Discharge: 2013-12-12 | Disposition: A | Discharge status: Home / Self Care | Payer: Medicaid Other | Source: Ambulatory Visit | Attending: Obstetrics & Gynecology | Admitting: Obstetrics & Gynecology

## 2013-12-12 DIAGNOSIS — O24419 Gestational diabetes mellitus in pregnancy, unspecified control: Secondary | ICD-10-CM

## 2013-12-12 DIAGNOSIS — Z3689 Encounter for other specified antenatal screening: Secondary | ICD-10-CM | POA: Insufficient documentation

## 2013-12-12 DIAGNOSIS — O99810 Abnormal glucose complicating pregnancy: Secondary | ICD-10-CM | POA: Insufficient documentation

## 2013-12-24 ENCOUNTER — Ambulatory Visit (INDEPENDENT_AMBULATORY_CARE_PROVIDER_SITE_OTHER): Payer: Medicaid Other | Admitting: Family Medicine

## 2013-12-24 ENCOUNTER — Encounter: Payer: Self-pay | Admitting: Family Medicine

## 2013-12-24 VITALS — BP 100/62 | HR 83 | Temp 97.6°F | Wt 152.1 lb

## 2013-12-24 DIAGNOSIS — O99810 Abnormal glucose complicating pregnancy: Secondary | ICD-10-CM

## 2013-12-24 DIAGNOSIS — Z843 Family history of consanguinity: Secondary | ICD-10-CM

## 2013-12-24 LAB — OB RESULTS CONSOLE GC/CHLAMYDIA
Chlamydia: NEGATIVE
Gonorrhea: NEGATIVE

## 2013-12-24 LAB — POCT URINALYSIS DIP (DEVICE)
BILIRUBIN URINE: NEGATIVE
Glucose, UA: NEGATIVE mg/dL
HGB URINE DIPSTICK: NEGATIVE
KETONES UR: NEGATIVE mg/dL
LEUKOCYTES UA: NEGATIVE
NITRITE: NEGATIVE
Protein, ur: NEGATIVE mg/dL
Specific Gravity, Urine: <=1.005 (ref 1.005–1.030)
Urobilinogen, UA: 0.2 mg/dL (ref 0.0–1.0)
pH: 5.5 (ref 5.0–8.0)

## 2013-12-24 NOTE — Progress Notes (Signed)
+  FM, no lof, no vb, no ctx  F 93/91/97/92/99/79/86/86/89/93/87 B 93/91/97/92/9997/93/97/99/97 L 99/93/99/98/101/93/97/96/101/99 D 127/134/140/127/130/102/129/110/121/114  Sugars at goal, cuationed against rice US utd, 47%tile VTX on US  GC/C and GBS collected  Sharon Mcconnell is a 33 y.o. G2P1001 at 5434w2d by L=26 here for ROB visit.    Discussed with Patient:  - Plans to breast feed.  All questions answered. - Continue prenatal vitamins. - Reviewed fetal kick counts Pt to perform daily at a time when the baby is active, lie laterally with both hands on belly in quiet room and count all movements (hiccups, shoulder rolls, obvious kicks, etc); pt is to report to clinic MAU for less than 10 movements felt in a 2 hour time period-pt told as soon as she counts 10 movements the count is complete.  - Routine precautions discussed (depression, infection s/s).   Patient provided with all pertinent phone numbers for emergencies. - RTC for any VB, regular, painful cramps/ctxs occurring at a rate of >2/10 min, fever (100.5 or higher), n/v/d, any pain that is unresolving or worsening, LOF, decreased fetal movement, CP, SOB, edema -RTC in one week for next visit.  Problems: Patient Active Problem List   Diagnosis Date Noted  . Supervision of high-risk pregnancy 10/08/2013  . Gestational diabetes 10/02/2013  . Language barrier 10/02/2013  . 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 Hx of tuberculosis 10/02/2013  . Late prenatal care 10/02/2013  . Exposure to second hand smoke 10/02/2013    To Do: 1.   [ ]  Vaccines: recd [ ]  BCM: nexplanon [ ]  Readiness: baby has a place to sleep, car seat, other baby necessities.  Edu: [ x] TL precautions; [ ]  BF class; [ ]  childbirth class; [ ]   BF counseling;

## 2013-12-24 NOTE — Patient Instructions (Signed)
Third Trimester of Pregnancy  The third trimester is from week 29 through week 42, months 7 through 9. The third trimester is a time when the fetus is growing rapidly. At the end of the ninth month, the fetus is about 20 inches in length and weighs 6 10 pounds.   BODY CHANGES  Your body goes through many changes during pregnancy. The changes vary from woman to woman.    Your weight will continue to increase. You can expect to gain 25 35 pounds (11 16 kg) by the end of the pregnancy.   You may begin to get stretch marks on your hips, abdomen, and breasts.   You may urinate more often because the fetus is moving lower into your pelvis and pressing on your bladder.   You may develop or continue to have heartburn as a result of your pregnancy.   You may develop constipation because certain hormones are causing the muscles that push waste through your intestines to slow down.   You may develop hemorrhoids or swollen, bulging veins (varicose veins).   You may have pelvic pain because of the weight gain and pregnancy hormones relaxing your joints between the bones in your pelvis. Back aches may result from over exertion of the muscles supporting your posture.   Your breasts will continue to grow and be tender. A yellow discharge may leak from your breasts called colostrum.   Your belly button may stick out.   You may feel short of breath because of your expanding uterus.   You may notice the fetus "dropping," or moving lower in your abdomen.   You may have a bloody mucus discharge. This usually occurs a few days to a week before labor begins.   Your cervix becomes thin and soft (effaced) near your due date.  WHAT TO EXPECT AT YOUR PRENATAL EXAMS   You will have prenatal exams every 2 weeks until week 36. Then, you will have weekly prenatal exams. During a routine prenatal visit:   You will be weighed to make sure you and the fetus are growing normally.   Your blood pressure is taken.   Your abdomen will be  measured to track your baby's growth.   The fetal heartbeat will be listened to.   Any test results from the previous visit will be discussed.   You may have a cervical check near your due date to see if you have effaced.  At around 36 weeks, your caregiver will check your cervix. At the same time, your caregiver will also perform a test on the secretions of the vaginal tissue. This test is to determine if a type of bacteria, Group B streptococcus, is present. Your caregiver will explain this further.  Your caregiver may ask you:   What your birth plan is.   How you are feeling.   If you are feeling the baby move.   If you have had any abnormal symptoms, such as leaking fluid, bleeding, severe headaches, or abdominal cramping.   If you have any questions.  Other tests or screenings that may be performed during your third trimester include:   Blood tests that check for low iron levels (anemia).   Fetal testing to check the health, activity level, and growth of the fetus. Testing is done if you have certain medical conditions or if there are problems during the pregnancy.  FALSE LABOR  You may feel small, irregular contractions that eventually go away. These are called Braxton Hicks contractions, or   false labor. Contractions may last for hours, days, or even weeks before true labor sets in. If contractions come at regular intervals, intensify, or become painful, it is best to be seen by your caregiver.   SIGNS OF LABOR    Menstrual-like cramps.   Contractions that are 5 minutes apart or less.   Contractions that start on the top of the uterus and spread down to the lower abdomen and back.   A sense of increased pelvic pressure or back pain.   A watery or bloody mucus discharge that comes from the vagina.  If you have any of these signs before the 37th week of pregnancy, call your caregiver right away. You need to go to the hospital to get checked immediately.  HOME CARE INSTRUCTIONS    Avoid all  smoking, herbs, alcohol, and unprescribed drugs. These chemicals affect the formation and growth of the baby.   Follow your caregiver's instructions regarding medicine use. There are medicines that are either safe or unsafe to take during pregnancy.   Exercise only as directed by your caregiver. Experiencing uterine cramps is a good sign to stop exercising.   Continue to eat regular, healthy meals.   Wear a good support bra for breast tenderness.   Do not use hot tubs, steam rooms, or saunas.   Wear your seat belt at all times when driving.   Avoid raw meat, uncooked cheese, cat litter boxes, and soil used by cats. These carry germs that can cause birth defects in the baby.   Take your prenatal vitamins.   Try taking a stool softener (if your caregiver approves) if you develop constipation. Eat more high-fiber foods, such as fresh vegetables or fruit and whole grains. Drink plenty of fluids to keep your urine clear or pale yellow.   Take warm sitz baths to soothe any pain or discomfort caused by hemorrhoids. Use hemorrhoid cream if your caregiver approves.   If you develop varicose veins, wear support hose. Elevate your feet for 15 minutes, 3 4 times a day. Limit salt in your diet.   Avoid heavy lifting, wear low heal shoes, and practice good posture.   Rest a lot with your legs elevated if you have leg cramps or low back pain.   Visit your dentist if you have not gone during your pregnancy. Use a soft toothbrush to brush your teeth and be gentle when you floss.   A sexual relationship may be continued unless your caregiver directs you otherwise.   Do not travel far distances unless it is absolutely necessary and only with the approval of your caregiver.   Take prenatal classes to understand, practice, and ask questions about the labor and delivery.   Make a trial run to the hospital.   Pack your hospital bag.   Prepare the baby's nursery.   Continue to go to all your prenatal visits as directed  by your caregiver.  SEEK MEDICAL CARE IF:   You are unsure if you are in labor or if your water has broken.   You have dizziness.   You have mild pelvic cramps, pelvic pressure, or nagging pain in your abdominal area.   You have persistent nausea, vomiting, or diarrhea.   You have a bad smelling vaginal discharge.   You have pain with urination.  SEEK IMMEDIATE MEDICAL CARE IF:    You have a fever.   You are leaking fluid from your vagina.   You have spotting or bleeding from your vagina.     You have severe abdominal cramping or pain.   You have rapid weight loss or gain.   You have shortness of breath with chest pain.   You notice sudden or extreme swelling of your face, hands, ankles, feet, or legs.   You have not felt your baby move in over an hour.   You have severe headaches that do not go away with medicine.   You have vision changes.  Document Released: 07/27/2001 Document Revised: 04/04/2013 Document Reviewed: 10/03/2012  Exit[employer] Patient Information 2014 [employer].

## 2013-12-25 LAB — GC/CHLAMYDIA PROBE AMP
CT PROBE, AMP APTIMA: NEGATIVE
GC Probe RNA: NEGATIVE

## 2013-12-26 ENCOUNTER — Encounter: Payer: Self-pay | Admitting: Family Medicine

## 2013-12-26 LAB — CULTURE, BETA STREP (GROUP B ONLY)

## 2013-12-31 ENCOUNTER — Encounter: Payer: Self-pay | Admitting: Obstetrics & Gynecology

## 2013-12-31 ENCOUNTER — Ambulatory Visit (INDEPENDENT_AMBULATORY_CARE_PROVIDER_SITE_OTHER): Payer: Medicaid Other | Admitting: Obstetrics & Gynecology

## 2013-12-31 VITALS — BP 104/69 | HR 82 | Temp 97.2°F | Wt 153.1 lb

## 2013-12-31 DIAGNOSIS — O99810 Abnormal glucose complicating pregnancy: Secondary | ICD-10-CM

## 2013-12-31 DIAGNOSIS — O24419 Gestational diabetes mellitus in pregnancy, unspecified control: Secondary | ICD-10-CM

## 2013-12-31 DIAGNOSIS — O0990 Supervision of high risk pregnancy, unspecified, unspecified trimester: Secondary | ICD-10-CM

## 2013-12-31 LAB — POCT URINALYSIS DIP (DEVICE)
Bilirubin Urine: NEGATIVE
Glucose, UA: NEGATIVE mg/dL
KETONES UR: NEGATIVE mg/dL
LEUKOCYTES UA: NEGATIVE
Nitrite: NEGATIVE
Protein, ur: NEGATIVE mg/dL
Specific Gravity, Urine: 1.015 (ref 1.005–1.030)
Urobilinogen, UA: 0.2 mg/dL (ref 0.0–1.0)
pH: 7 (ref 5.0–8.0)

## 2013-12-31 NOTE — Progress Notes (Signed)
BG testing nearly all in range and may continue diet control. Report s/sx of labor.

## 2013-12-31 NOTE — Patient Instructions (Signed)

## 2013-12-31 NOTE — Progress Notes (Signed)
Used pacific interpreter #[email or web address]

## 2014-01-08 ENCOUNTER — Ambulatory Visit (INDEPENDENT_AMBULATORY_CARE_PROVIDER_SITE_OTHER): Payer: Medicaid Other | Admitting: Family Medicine

## 2014-01-08 VITALS — BP 107/72 | HR 76 | Temp 97.3°F | Wt 154.4 lb

## 2014-01-08 DIAGNOSIS — O24419 Gestational diabetes mellitus in pregnancy, unspecified control: Secondary | ICD-10-CM

## 2014-01-08 DIAGNOSIS — O0990 Supervision of high risk pregnancy, unspecified, unspecified trimester: Secondary | ICD-10-CM

## 2014-01-08 DIAGNOSIS — Z8611 Personal history of tuberculosis: Secondary | ICD-10-CM

## 2014-01-08 DIAGNOSIS — O99810 Abnormal glucose complicating pregnancy: Secondary | ICD-10-CM

## 2014-01-08 LAB — POCT URINALYSIS DIP (DEVICE)
Bilirubin Urine: NEGATIVE
GLUCOSE, UA: NEGATIVE mg/dL
Hgb urine dipstick: NEGATIVE
KETONES UR: NEGATIVE mg/dL
Leukocytes, UA: NEGATIVE
Nitrite: NEGATIVE
Protein, ur: NEGATIVE mg/dL
Specific Gravity, Urine: 1.01 (ref 1.005–1.030)
Urobilinogen, UA: 0.2 mg/dL (ref 0.0–1.0)
pH: 7 (ref 5.0–8.0)

## 2014-01-08 NOTE — Progress Notes (Signed)
S: 33 yo G2P1001 @ [redacted]w[redacted]d here for ROBV - A1DM- controlled. All sugars but one fasting are in range.  - no ctx, lof, vb. +FM.  - reviewed [place] at 36 weeks and appropriate growth  O: see flowsheet  A/P - induction set up for [redacted]w[redacted]d for A1DM controlled - discussed induction - f/u on L&D in 4 days and here in 6weeks for postpartum

## 2014-01-08 NOTE — Progress Notes (Signed)
Patient reports pelvic pain/pressure with standing

## 2014-01-08 NOTE — Patient Instructions (Signed)
Third Trimester of Pregnancy  The third trimester is from week 29 through week 42, months 7 through 9. The third trimester is a time when the fetus is growing rapidly. At the end of the ninth month, the fetus is about 20 inches in length and weighs 6 10 pounds.   BODY CHANGES  Your body goes through many changes during pregnancy. The changes vary from woman to woman.    Your weight will continue to increase. You can expect to gain 25 35 pounds (11 16 kg) by the end of the pregnancy.   You may begin to get stretch marks on your hips, abdomen, and breasts.   You may urinate more often because the fetus is moving lower into your pelvis and pressing on your bladder.   You may develop or continue to have heartburn as a result of your pregnancy.   You may develop constipation because certain hormones are causing the muscles that push waste through your intestines to slow down.   You may develop hemorrhoids or swollen, bulging veins (varicose veins).   You may have pelvic pain because of the weight gain and pregnancy hormones relaxing your joints between the bones in your pelvis. Back aches may result from over exertion of the muscles supporting your posture.   Your breasts will continue to grow and be tender. A yellow discharge may leak from your breasts called colostrum.   Your belly button may stick out.   You may feel short of breath because of your expanding uterus.   You may notice the fetus "dropping," or moving lower in your abdomen.   You may have a bloody mucus discharge. This usually occurs a few days to a week before labor begins.   Your cervix becomes thin and soft (effaced) near your due date.  WHAT TO EXPECT AT YOUR PRENATAL EXAMS   You will have prenatal exams every 2 weeks until week 36. Then, you will have weekly prenatal exams. During a routine prenatal visit:   You will be weighed to make sure you and the fetus are growing normally.   Your blood pressure is taken.   Your abdomen will be  measured to track your baby's growth.   The fetal heartbeat will be listened to.   Any test results from the previous visit will be discussed.   You may have a cervical check near your due date to see if you have effaced.  At around 36 weeks, your caregiver will check your cervix. At the same time, your caregiver will also perform a test on the secretions of the vaginal tissue. This test is to determine if a type of bacteria, Group B streptococcus, is present. Your caregiver will explain this further.  Your caregiver may ask you:   What your birth plan is.   How you are feeling.   If you are feeling the baby move.   If you have had any abnormal symptoms, such as leaking fluid, bleeding, severe headaches, or abdominal cramping.   If you have any questions.  Other tests or screenings that may be performed during your third trimester include:   Blood tests that check for low iron levels (anemia).   Fetal testing to check the health, activity level, and growth of the fetus. Testing is done if you have certain medical conditions or if there are problems during the pregnancy.  FALSE LABOR  You may feel small, irregular contractions that eventually go away. These are called Braxton Hicks contractions, or   false labor. Contractions may last for hours, days, or even weeks before true labor sets in. If contractions come at regular intervals, intensify, or become painful, it is best to be seen by your caregiver.   SIGNS OF LABOR    Menstrual-like cramps.   Contractions that are 5 minutes apart or less.   Contractions that start on the top of the uterus and spread down to the lower abdomen and back.   A sense of increased pelvic pressure or back pain.   A watery or bloody mucus discharge that comes from the vagina.  If you have any of these signs before the 37th week of pregnancy, call your caregiver right away. You need to go to the hospital to get checked immediately.  HOME CARE INSTRUCTIONS    Avoid all  smoking, herbs, alcohol, and unprescribed drugs. These chemicals affect the formation and growth of the baby.   Follow your caregiver's instructions regarding medicine use. There are medicines that are either safe or unsafe to take during pregnancy.   Exercise only as directed by your caregiver. Experiencing uterine cramps is a good sign to stop exercising.   Continue to eat regular, healthy meals.   Wear a good support bra for breast tenderness.   Do not use hot tubs, steam rooms, or saunas.   Wear your seat belt at all times when driving.   Avoid raw meat, uncooked cheese, cat litter boxes, and soil used by cats. These carry germs that can cause birth defects in the baby.   Take your prenatal vitamins.   Try taking a stool softener (if your caregiver approves) if you develop constipation. Eat more high-fiber foods, such as fresh vegetables or fruit and whole grains. Drink plenty of fluids to keep your urine clear or pale yellow.   Take warm sitz baths to soothe any pain or discomfort caused by hemorrhoids. Use hemorrhoid cream if your caregiver approves.   If you develop varicose veins, wear support hose. Elevate your feet for 15 minutes, 3 4 times a day. Limit salt in your diet.   Avoid heavy lifting, wear low heal shoes, and practice good posture.   Rest a lot with your legs elevated if you have leg cramps or low back pain.   Visit your dentist if you have not gone during your pregnancy. Use a soft toothbrush to brush your teeth and be gentle when you floss.   A sexual relationship may be continued unless your caregiver directs you otherwise.   Do not travel far distances unless it is absolutely necessary and only with the approval of your caregiver.   Take prenatal classes to understand, practice, and ask questions about the labor and delivery.   Make a trial run to the hospital.   Pack your hospital bag.   Prepare the baby's nursery.   Continue to go to all your prenatal visits as directed  by your caregiver.  SEEK MEDICAL CARE IF:   You are unsure if you are in labor or if your water has broken.   You have dizziness.   You have mild pelvic cramps, pelvic pressure, or nagging pain in your abdominal area.   You have persistent nausea, vomiting, or diarrhea.   You have a bad smelling vaginal discharge.   You have pain with urination.  SEEK IMMEDIATE MEDICAL CARE IF:    You have a fever.   You are leaking fluid from your vagina.   You have spotting or bleeding from your vagina.     You have severe abdominal cramping or pain.   You have rapid weight loss or gain.   You have shortness of breath with chest pain.   You notice sudden or extreme swelling of your face, hands, ankles, feet, or legs.   You have not felt your baby move in over an hour.   You have severe headaches that do not go away with medicine.   You have vision changes.  Document Released: 07/27/2001 Document Revised: 04/04/2013 Document Reviewed: 10/03/2012  Exit[employer] Patient Information 2014 [employer].

## 2014-01-10 ENCOUNTER — Telehealth (HOSPITAL_COMMUNITY): Payer: Self-pay | Admitting: *Deleted

## 2014-01-10 LAB — OB RESULTS CONSOLE GBS: STREP GROUP B AG: NEGATIVE

## 2014-01-10 NOTE — Telephone Encounter (Signed)
Preadmission screen Interpreter number [phone]

## 2014-01-12 ENCOUNTER — Encounter (HOSPITAL_COMMUNITY): Payer: Medicaid Other | Admitting: Anesthesiology

## 2014-01-12 ENCOUNTER — Inpatient Hospital Stay (HOSPITAL_COMMUNITY): Payer: Medicaid Other | Admitting: Anesthesiology

## 2014-01-12 ENCOUNTER — Encounter (HOSPITAL_COMMUNITY): Payer: Self-pay

## 2014-01-12 ENCOUNTER — Inpatient Hospital Stay (HOSPITAL_COMMUNITY)
Admission: RE | Admit: 2014-01-12 | Discharge: 2014-01-14 | DRG: 775 | Disposition: A | Discharge status: Home / Self Care | Payer: Medicaid Other | Source: Ambulatory Visit | Attending: Obstetrics and Gynecology | Admitting: Obstetrics and Gynecology

## 2014-01-12 VITALS — BP 101/61 | HR 56 | Temp 97.9°F | Resp 18 | Ht 61.0 in | Wt 154.0 lb

## 2014-01-12 DIAGNOSIS — Z8611 Personal history of tuberculosis: Secondary | ICD-10-CM

## 2014-01-12 DIAGNOSIS — Z3490 Encounter for supervision of normal pregnancy, unspecified, unspecified trimester: Secondary | ICD-10-CM

## 2014-01-12 DIAGNOSIS — O99814 Abnormal glucose complicating childbirth: Principal | ICD-10-CM | POA: Diagnosis present

## 2014-01-12 LAB — CBC
HCT: 32.5 % — ABNORMAL LOW (ref 36.0–46.0)
Hemoglobin: 10.5 g/dL — ABNORMAL LOW (ref 12.0–15.0)
MCH: 24.8 pg — ABNORMAL LOW (ref 26.0–34.0)
MCHC: 32.3 g/dL (ref 30.0–36.0)
MCV: 76.8 fL — ABNORMAL LOW (ref 78.0–100.0)
PLATELETS: 211 10*3/uL (ref 150–400)
RBC: 4.23 MIL/uL (ref 3.87–5.11)
RDW: 15.5 % (ref 11.5–15.5)
WBC: 7.9 10*3/uL (ref 4.0–10.5)

## 2014-01-12 LAB — TYPE AND SCREEN
ABO/RH(D): B POS
Antibody Screen: NEGATIVE

## 2014-01-12 LAB — GLUCOSE, CAPILLARY: GLUCOSE-CAPILLARY: 80 mg/dL (ref 70–99)

## 2014-01-12 LAB — GLUCOSE, RANDOM: Glucose, Bld: 86 mg/dL (ref 70–99)

## 2014-01-12 LAB — RPR

## 2014-01-12 MED ORDER — PNEUMOCOCCAL VAC POLYVALENT 25 MCG/0.5ML IJ INJ
0.5000 mL | INJECTION | INTRAMUSCULAR | Status: AC
  Administered 2014-01-13: 0.5 mL via INTRAMUSCULAR
  Filled 2014-01-12: qty 0.5

## 2014-01-12 MED ORDER — PRENATAL MULTIVITAMIN CH
1.0000 | ORAL_TABLET | Freq: Every day | ORAL | Status: DC
Start: 2014-01-13 — End: 2014-01-14
  Administered 2014-01-13 – 2014-01-14 (×2): 1 via ORAL
  Filled 2014-01-12 (×2): qty 1

## 2014-01-12 MED ORDER — LACTATED RINGERS IV SOLN
INTRAVENOUS | Status: DC
  Administered 2014-01-12 (×2): via INTRAVENOUS

## 2014-01-12 MED ORDER — DIPHENHYDRAMINE HCL 25 MG PO CAPS: 25.0000 mg | ORAL_CAPSULE | Freq: Four times a day (QID) | ORAL | Status: DC | PRN

## 2014-01-12 MED ORDER — DIBUCAINE 1 % RE OINT: 1.0000 "application " | TOPICAL_OINTMENT | RECTAL | Status: DC | PRN

## 2014-01-12 MED ORDER — SENNOSIDES-DOCUSATE SODIUM 8.6-50 MG PO TABS
2.0000 | ORAL_TABLET | ORAL | Status: DC
  Administered 2014-01-12 – 2014-01-14 (×2): 2 via ORAL
  Filled 2014-01-12 (×2): qty 2

## 2014-01-12 MED ORDER — FENTANYL 2.5 MCG/ML BUPIVACAINE 1/10 % EPIDURAL INFUSION (WH - ANES): 14.0000 mL/h | INTRAMUSCULAR | Status: DC | PRN

## 2014-01-12 MED ORDER — EPHEDRINE 5 MG/ML INJ
10.0000 mg | INTRAVENOUS | Status: DC | PRN
  Filled 2014-01-12: qty 4
  Filled 2014-01-12: qty 2

## 2014-01-12 MED ORDER — PHENYLEPHRINE 40 MCG/ML (10ML) SYRINGE FOR IV PUSH (FOR BLOOD PRESSURE SUPPORT)
80.0000 ug | PREFILLED_SYRINGE | INTRAVENOUS | Status: DC | PRN
  Filled 2014-01-12: qty 2

## 2014-01-12 MED ORDER — FLEET ENEMA 7-19 GM/118ML RE ENEM: 1.0000 | ENEMA | RECTAL | Status: DC | PRN

## 2014-01-12 MED ORDER — OXYTOCIN BOLUS FROM INFUSION: 500.0000 mL | INTRAVENOUS | Status: DC

## 2014-01-12 MED ORDER — ONDANSETRON HCL 4 MG/2ML IJ SOLN: 4.0000 mg | INTRAMUSCULAR | Status: DC | PRN

## 2014-01-12 MED ORDER — PHENYLEPHRINE 40 MCG/ML (10ML) SYRINGE FOR IV PUSH (FOR BLOOD PRESSURE SUPPORT)
80.0000 ug | PREFILLED_SYRINGE | INTRAVENOUS | Status: DC | PRN
  Filled 2014-01-12: qty 10
  Filled 2014-01-12: qty 2

## 2014-01-12 MED ORDER — EPHEDRINE 5 MG/ML INJ
10.0000 mg | INTRAVENOUS | Status: DC | PRN
  Filled 2014-01-12: qty 2

## 2014-01-12 MED ORDER — ONDANSETRON HCL 4 MG PO TABS: 4.0000 mg | ORAL_TABLET | ORAL | Status: DC | PRN

## 2014-01-12 MED ORDER — LACTATED RINGERS IV SOLN: 500.0000 mL | Freq: Once | INTRAVENOUS | Status: DC

## 2014-01-12 MED ORDER — LANOLIN HYDROUS EX OINT: TOPICAL_OINTMENT | CUTANEOUS | Status: DC | PRN

## 2014-01-12 MED ORDER — OXYCODONE-ACETAMINOPHEN 5-325 MG PO TABS
1.0000 | ORAL_TABLET | ORAL | Status: DC | PRN
  Administered 2014-01-13 – 2014-01-14 (×2): 1 via ORAL
  Filled 2014-01-12 (×2): qty 1

## 2014-01-12 MED ORDER — MEASLES, MUMPS & RUBELLA VAC ~~LOC~~ INJ
0.5000 mL | INJECTION | Freq: Once | SUBCUTANEOUS | Status: DC
  Filled 2014-01-12: qty 0.5

## 2014-01-12 MED ORDER — IBUPROFEN 600 MG PO TABS
600.0000 mg | ORAL_TABLET | Freq: Four times a day (QID) | ORAL | Status: DC
  Administered 2014-01-12 – 2014-01-14 (×8): 600 mg via ORAL
  Filled 2014-01-12 (×8): qty 1

## 2014-01-12 MED ORDER — SODIUM CHLORIDE 0.9 % IJ SOLN: 3.0000 mL | Freq: Two times a day (BID) | INTRAMUSCULAR | Status: DC

## 2014-01-12 MED ORDER — OXYTOCIN 40 UNITS IN LACTATED RINGERS INFUSION - SIMPLE MED
62.5000 mL/h | INTRAVENOUS | Status: DC
  Filled 2014-01-12: qty 1000

## 2014-01-12 MED ORDER — TERBUTALINE SULFATE 1 MG/ML IJ SOLN: 0.2500 mg | Freq: Once | INTRAMUSCULAR | Status: DC | PRN

## 2014-01-12 MED ORDER — LIDOCAINE HCL (PF) 1 % IJ SOLN
INTRAMUSCULAR | Status: DC | PRN
  Administered 2014-01-12 (×2): 4 mL

## 2014-01-12 MED ORDER — LIDOCAINE HCL (PF) 1 % IJ SOLN
30.0000 mL | INTRAMUSCULAR | Status: DC | PRN
  Filled 2014-01-12: qty 30

## 2014-01-12 MED ORDER — ACETAMINOPHEN 325 MG PO TABS
650.0000 mg | ORAL_TABLET | ORAL | Status: DC | PRN
  Administered 2014-01-12: 650 mg via ORAL
  Filled 2014-01-12: qty 2

## 2014-01-12 MED ORDER — OXYTOCIN 40 UNITS IN LACTATED RINGERS INFUSION - SIMPLE MED
1.0000 m[IU]/min | INTRAVENOUS | Status: DC
  Administered 2014-01-12: 2 m[IU]/min via INTRAVENOUS

## 2014-01-12 MED ORDER — BENZOCAINE-MENTHOL 20-0.5 % EX AERO
1.0000 "application " | INHALATION_SPRAY | CUTANEOUS | Status: DC | PRN
  Filled 2014-01-12: qty 56

## 2014-01-12 MED ORDER — FENTANYL 2.5 MCG/ML BUPIVACAINE 1/10 % EPIDURAL INFUSION (WH - ANES)
14.0000 mL/h | INTRAMUSCULAR | Status: DC | PRN
  Filled 2014-01-12: qty 125

## 2014-01-12 MED ORDER — ZOLPIDEM TARTRATE 5 MG PO TABS: 5.0000 mg | ORAL_TABLET | Freq: Every evening | ORAL | Status: DC | PRN

## 2014-01-12 MED ORDER — FENTANYL CITRATE 0.05 MG/ML IJ SOLN: 100.0000 ug | INTRAMUSCULAR | Status: DC | PRN

## 2014-01-12 MED ORDER — ONDANSETRON HCL 4 MG/2ML IJ SOLN: 4.0000 mg | Freq: Four times a day (QID) | INTRAMUSCULAR | Status: DC | PRN

## 2014-01-12 MED ORDER — BISACODYL 10 MG RE SUPP: 10.0000 mg | Freq: Every day | RECTAL | Status: DC | PRN

## 2014-01-12 MED ORDER — DIPHENHYDRAMINE HCL 50 MG/ML IJ SOLN: 12.5000 mg | INTRAMUSCULAR | Status: DC | PRN

## 2014-01-12 MED ORDER — TETANUS-DIPHTH-ACELL PERTUSSIS 5-2.5-18.5 LF-MCG/0.5 IM SUSP: 0.5000 mL | Freq: Once | INTRAMUSCULAR | Status: DC

## 2014-01-12 MED ORDER — SODIUM CHLORIDE 0.9 % IV SOLN: 250.0000 mL | INTRAVENOUS | Status: DC | PRN

## 2014-01-12 MED ORDER — FENTANYL 2.5 MCG/ML BUPIVACAINE 1/10 % EPIDURAL INFUSION (WH - ANES)
INTRAMUSCULAR | Status: DC | PRN
  Administered 2014-01-12: 14 mL/h via EPIDURAL

## 2014-01-12 MED ORDER — CITRIC ACID-SODIUM CITRATE 334-500 MG/5ML PO SOLN: 30.0000 mL | ORAL | Status: DC | PRN

## 2014-01-12 MED ORDER — SODIUM CHLORIDE 0.9 % IJ SOLN: 3.0000 mL | INTRAMUSCULAR | Status: DC | PRN

## 2014-01-12 MED ORDER — OXYCODONE-ACETAMINOPHEN 5-325 MG PO TABS: 1.0000 | ORAL_TABLET | ORAL | Status: DC | PRN

## 2014-01-12 MED ORDER — WITCH HAZEL-GLYCERIN EX PADS: 1.0000 "application " | MEDICATED_PAD | CUTANEOUS | Status: DC | PRN

## 2014-01-12 MED ORDER — SIMETHICONE 80 MG PO CHEW
80.0000 mg | CHEWABLE_TABLET | ORAL | Status: DC | PRN
  Filled 2014-01-12: qty 1

## 2014-01-12 MED ORDER — OXYTOCIN 40 UNITS IN LACTATED RINGERS INFUSION - SIMPLE MED: 62.5000 mL/h | INTRAVENOUS | Status: DC | PRN

## 2014-01-12 MED ORDER — LACTATED RINGERS IV SOLN: 500.0000 mL | INTRAVENOUS | Status: DC | PRN

## 2014-01-12 MED ORDER — FLEET ENEMA 7-19 GM/118ML RE ENEM: 1.0000 | ENEMA | Freq: Every day | RECTAL | Status: DC | PRN

## 2014-01-12 MED ORDER — IBUPROFEN 600 MG PO TABS: 600.0000 mg | ORAL_TABLET | Freq: Four times a day (QID) | ORAL | Status: DC | PRN

## 2014-01-12 NOTE — Anesthesia Procedure Notes (Signed)
Epidural Patient location during procedure: OB Start time: 01/12/2014 11:10 AM End time: 01/12/2014 11:20 AM  Staffing Anesthesiologist: Lewie Loron R Performed by: anesthesiologist   Preanesthetic Checklist Completed: patient identified, pre-op evaluation, timeout performed, IV checked, risks and benefits discussed and monitors and equipment checked  Epidural Patient position: sitting Prep: site prepped and draped and DuraPrep Patient monitoring: heart rate Approach: midline Location: L2-L3 Injection technique: LOR air and LOR saline  Needle:  Needle type: Tuohy  Needle gauge: 17 G Needle length: 9 cm Needle insertion depth: 5 cm Catheter type: closed end flexible Catheter size: 19 Gauge Catheter at skin depth: 10 cm Test dose: negative  Assessment Sensory level: T8 Events: blood not aspirated, injection not painful, no injection resistance, negative IV test and no paresthesia  Additional Notes Reason for block:procedure for pain

## 2014-01-12 NOTE — Progress Notes (Signed)
Have been waiting on line for at least 15 min to try to connect with burmese interpreter

## 2014-01-12 NOTE — Progress Notes (Signed)
Admission completed using Burmese interpreter from language line.

## 2014-01-12 NOTE — Progress Notes (Signed)
BP noted, pt moving arm all around

## 2014-01-12 NOTE — Anesthesia Preprocedure Evaluation (Signed)
Anesthesia Evaluation  Patient identified by MRN, date of birth, ID band Patient awake    Reviewed: Allergy & Precautions, H&P , Patient's Chart, lab work & pertinent test results  Airway Mallampati: II TM Distance: >3 FB Neck ROM: full    Dental no notable dental hx.    Pulmonary neg pulmonary ROS,  breath sounds clear to auscultation  Pulmonary exam normal       Cardiovascular Exercise Tolerance: Good negative cardio ROS  Rhythm:regular Rate:Normal     Neuro/Psych negative neurological ROS  negative psych ROS   GI/Hepatic negative GI ROS, Neg liver ROS,   Endo/Other  negative endocrine ROSdiabetes  Renal/GU negative Renal ROS     Musculoskeletal negative musculoskeletal ROS (+)   Abdominal   Peds negative pediatric ROS (+)  Hematology negative hematology ROS (+)   Anesthesia Other Findings Hx of tuberculosis   treated Hx: UTI (urinary tract infection)   Reproductive/Obstetrics negative OB ROS (+) Pregnancy                           Anesthesia Physical  Anesthesia Plan  ASA: II  Anesthesia Plan: Epidural   Post-op Pain Management:    Induction:   Airway Management Planned:   Additional Equipment:   Intra-op Plan:   Post-operative Plan:   Informed Consent: I have reviewed the patients History and Physical, chart, labs and discussed the procedure including the risks, benefits and alternatives for the proposed anesthesia with the patient or authorized representative who has indicated his/her understanding and acceptance.     Plan Discussed with:   Anesthesia Plan Comments:         Anesthesia Quick Evaluation

## 2014-01-12 NOTE — H&P (Signed)
Sharon Mcconnell is a 33 y.o. G66P1001 female at 53w0dby LMP which correlates w/in 1d of 26.4wk u/s, presenting for IOL d/t A1DM.   Reports active fetal movement, contractions: regular-slightly uncomfortable since 0800, vaginal bleeding: none, membranes: intact. [facility]  [place]-speaking only. Most recent u/s 12/12/13 @ 35.4wks, EFW 5lb9oz/47% H/O 40.5wk VAVD of 6lb 9.5oz baby w/ partial 3rd degree tear, in 2013 d/t ineffective pushing/language barrier  Past Medical History: Past Medical History  Diagnosis Date  . Hx of tuberculosis     treated; on 05/20/13 pt denies any knowledge of TB hx  . Hx: UTI (urinary tract infection)   . Gestational diabetes 10/02/2013    1 hour 183, 2 hour 159     Past Surgical History: Past Surgical History  Procedure Laterality Date  . No past surgeries      Obstetrical History: OB History   Grav Para Term Preterm Abortions TAB SAB Ect Mult Living   _0 0 0 0 0 0 0 1      Social History: History   Social History  . Marital Status: Married    Spouse Name: N/A    Number of Children: N/A  . Years of Education: N/A   Social History Main Topics  . Smoking status: Never Smoker   . Smokeless tobacco: Never Used  . Alcohol Use: No  . Drug Use: No  . Sexual Activity: Yes    Birth Control/ Protection: Injection   Other Topics Concern  . None   Social History Narrative  . None    Family History: No family history on file.  Allergies: No Known Allergies  Prescriptions prior to admission  Medication Sig Dispense Refill  . ACCU-CHEK FASTCLIX LANCETS MISC 1 each by Percutaneous route 4 (four) times daily.  102 each  12  . Blood Glucose Monitoring Suppl (ACCU-CHEK AVIVA PLUS) W/DEVICE KIT 1 each by Does not apply route 4 (four) times daily.  1 kit  0  . glucose blood test strip Use as instructed  100 each  12  . Prenatal Vit-Fe Fumarate-FA (PRENATAL MULTIVITAMIN) TABS tablet Take 1 tablet by  mouth daily.  90 tablet  2    Review of Systems  Pertinent pos/neg as indicated in HPI  Blood pressure 113/66, pulse 74, temperature 97.5 F (36.4 C), temperature source Oral, resp. rate 18, height _1  (1.549 m), weight 69.854 kg (154 lb), last menstrual period 04/07/2013, currently breastfeeding. General appearance: alert, cooperative and no distress Lungs: clear to auscultation bilaterally Heart: regular rate and rhythm Abdomen: gravid, soft, non-tender Extremities: No edema DTR's 2+  Fetal monitoring: FHR: 130 bpm, variability: moderate,  Accelerations: Present,  decelerations:  Absent Uterine activity: irregular, 4-8    Presentation: cephalic  SVE: 45/79/-7 vtx, soft  Prenatal labs: ABO, Rh: B/Positive/-- (02/05 0000) Antibody: Negative (02/05 0000) Rubella:  Immune RPR: NON REAC (03/16 0938)  HBsAg: Negative (02/05 0000)  HIV: Non-reactive (02/05 0000)  GBS: Negative (05/28 0000)   1 hr Glucola: 153, 3hr gtt: 83/183/159/131 Genetic screening:  Too late Anatomy [place] normal female  Results for orders placed during the hospital encounter of 01/12/14 (from the past 24 hour(s))  CBC   Collection Time    01/12/14  7:55 AM      Result Value Ref Range   WBC 7.9  4.0 - 10.5 K/uL   RBC 4.23  3.87 - 5.11 MIL/uL   Hemoglobin 10.5 (*) 12.0 - 15.0  g/dL   HCT 32.5 (*) 36.0 - 46.0 %   MCV 76.8 (*) 78.0 - 100.0 fL   MCH 24.8 (*) 26.0 - 34.0 pg   MCHC 32.3  30.0 - 36.0 g/dL   RDW 15.5  11.5 - 15.5 %   Platelets 211  150 - 400 K/uL     Assessment:  7w0dSIUP  G2P1001  IOL for A1DM, already in early/latent phase labor now  Cat 1 FHR  GBS Negative (05/28 0000)  Language barrier  H/O VAVD w/ partial 3rd degree tear  Plan:  Admit to BS  Pitocin per protocol  IV pain meds/epidural prn active labor  CBGs q 4hr latent phase, q 2hr active  Anticipate NSVD   Plans to breastfeed  Contraception: nexplanon  Circumcision: n/a  KTawnya CrookCNM,  WHNP-BC 01/12/2014, 9:12 AM

## 2014-01-12 NOTE — H&P (Signed)
Attestation of Attending Supervision of Advanced Practitioner (CNM/NP): Evaluation and management procedures were performed by the Advanced Practitioner under my supervision and collaboration.  I have reviewed the Advanced Practitioner's note and chart, and I agree with the management and plan.  Dierra Riesgo 01/12/2014 10:57 AM

## 2014-01-13 NOTE — Anesthesia Postprocedure Evaluation (Signed)
  Anesthesia Post Note  Patient: Sharon Mcconnell  Procedure(s) Performed: * No procedures listed *  Anesthesia type: Epidural  Patient location: Mother/Baby  Post pain: Pain level controlled  Post assessment: Post-op Vital signs reviewed  Last Vitals:  Filed Vitals:   01/13/14 0500  BP: 101/65  Pulse: 71  Temp: 36.4 C  Resp: 18    Post vital signs: Reviewed  Level of consciousness:alert  Complications: No apparent anesthesia complications

## 2014-01-13 NOTE — Lactation Note (Signed)
This note was copied from the chart of Sharon Corena Racicot. Lactation Consultation Note  Patient Name: Sharon Mcconnell Today's Date: 01/13/2014 Reason for consult: Initial assessment Mom plans to breast and bottle feed. She is having uterine cramping with baby at the breast. [employer] interpreter [phone] [place]ese used for visit. Explained to Mom the reason for her cramping. Mom has been giving lots of bottle and RN reports not getting good depth with baby at the breast. Baby very fussy at this visit, could not get her to organize her suck till [facility] gave small amount of supplement via bottle then switched to breast. Then baby latched without difficulty and demonstrated a good rhythmic suck, some swallows noted. Mom has lots of colostrum with hand expression. Through interpreter explained to Mom importance of Baby BF each feeding to bring milk in and to prevent nipple confusion. Encouraged to BF with each feeding every 2-3 hours for 15-20 minutes, both breasts when possible. If Mom continues to supplement, reviewed guidelines for supplementing per hours of age and encouraged Mom to follow these guidelines showing her how to measure with medicine cup. Lactation brochure left for review, advised of OP services and support group. Encouraged to call for assist if desired.   Maternal Data Formula Feeding for Exclusion: Yes Reason for exclusion: Mother's choice to formula and breast feed on admission Infant to breast within first hour of birth: Yes Has patient been taught Hand Expression?: Yes Does the patient have breastfeeding experience prior to this delivery?: Yes  Feeding Feeding Type: Breast Fed Length of feed: 5 min  LATCH Score/Interventions Latch: Repeated attempts needed to sustain latch, nipple held in mouth throughout feeding, stimulation needed to elicit sucking reflex. (difficult time organizing her suck)  Audible Swallowing: A few with stimulation  Type of Nipple: Everted at rest and after  stimulation  Comfort (Breast/Nipple): Soft / non-tender     Hold (Positioning): Assistance needed to correctly position infant at breast and maintain latch. (obtaining more depth)  LATCH Score: 7  Lactation Tools Discussed/Used     Consult Status Consult Status: Follow-up Date: 01/14/14 Follow-up type: In-patient    Alfred Levins 01/13/2014, 7:49 PM

## 2014-01-13 NOTE — Progress Notes (Signed)
Post Partum Day 1 Subjective: no complaints, up ad lib, voiding, tolerating PO, + flatus and used bermese interpreter for interview. No complaints  Objective: Blood pressure 101/65, pulse 71, temperature 97.5 F (36.4 C), temperature source Oral, resp. rate 18, height 5\' 1"  (1.549 m), weight 69.854 kg (154 lb), last menstrual period 04/07/2013, SpO2 98.00%, unknown if currently breastfeeding.  Physical Exam:  General: alert, cooperative, appears stated age and no distress Lochia: appropriate Uterine Fundus: Firm U-1 DVT Evaluation: No evidence of DVT seen on physical exam. Negative Homan's sign. No cords or calf tenderness. No significant calf/ankle edema.   Recent Labs  01/12/14 0755  HGB 10.5*  HCT 32.5*    Assessment/Plan: Plan for discharge tomorrow and Breastfeeding Pain well controlled. Meeting milestones. Nexplanon for MOC   LOS: 1 day   Minta Balsam 01/13/2014, 9:01 AM

## 2014-01-14 ENCOUNTER — Ambulatory Visit: Payer: Self-pay

## 2014-01-14 NOTE — Discharge Summary (Signed)
`````  Attestation of Attending Supervision of Advanced Practitioner: Evaluation and management procedures were performed by the PA/NP/CNM/OB Fellow under my supervision/collaboration. Chart reviewed and agree with management and plan.  Christin BachJohn Derrisha Foos V 01/14/2014 12:59 PM

## 2014-01-14 NOTE — Lactation Note (Addendum)
This note was copied from the chart of Sharon Mcconnell. Lactation Consultation Note  Patient Name: Sharon Mcconnell Today's Date: 01/14/2014 Reason for consult: Follow-up assessment;Other (Comment) ([employer] interpreter for [place]ese [phone] Yne ) Per mom baby has been latching on and the last time to the breast was at 1039 for 101-15 mins , denies sore nipples and breast are not filling yet. LC encouraged mom to be consistent with breast feeding both breast and if the baby is satisfied hold off on supplementing, also keep supplementing to  A minimum. Discussed sore nipple and engorgement prevention and tx if needed, Instructed on the use hand pump and check the size of the flange , #24 Flange ( nipples both appear healthy)  Was a good fit. Mother informed of post-discharge support and given phone number to the lactation department, including services for phone call assistance; out-patient appointments; and breastfeeding support group. List of other breastfeeding resources in the community given in the handout. Encouraged mother to call for problems or concerns related to breastfeeding.   Maternal Data    Feeding Feeding Type: Breast Fed Nipple Type: Slow - flow Length of feed: 10 min (10-15 mins per mom per [facility] interpreter [phone] Yne )  LATCH Score/Interventions                Intervention(s): Breastfeeding basics reviewed (see LC note )     Lactation Tools Discussed/Used Tools: Pump Breast pump type: Manual Pump Review: Setup, frequency, and cleaning;Milk Storage Initiated by:: MAI  Date initiated:: 01/14/14   Consult Status Consult Status: Complete    Matilde Sprang Jocee Kissick 01/14/2014, 1:23 PM

## 2014-01-14 NOTE — Progress Notes (Signed)
UR chart review completed.  

## 2014-01-14 NOTE — Discharge Summary (Signed)
Obstetric Discharge Summary Reason for Admission: induction of labor Prenatal Procedures: ultrasound Intrapartum Procedures: spontaneous vaginal delivery Postpartum Procedures: none Complications-Operative and Postpartum: none Hemoglobin  Date Value Ref Range Status  01/12/2014 10.5* 12.0 - 15.0 g/dL Final  03/16/8298 37.1   Final     HCT  Date Value Ref Range Status  01/12/2014 32.5* 36.0 - 46.0 % Final  09/20/2013 33   Final    Sharon Mcconnell is a 33 y.o. G2P1001 female at [redacted]w[redacted]d by LMP which correlates w/in 1d of 26.4wk u/s, presenting for IOL d/t A1DM. H/O 40.5wk VAVD of 6lb 9.5oz baby w/ partial 3rd degree tear, in 2013 d/t ineffective pushing/language barrier. At 12:38 PM (5/30) a viable female was delivered via Vaginal, Spontaneous Delivery (Presentation: Right Occiput Anterior) en caul w/ compound posterior (Rt) hand. Vigorous infant placed directly on mom's abdomen for bonding/skin-to-skin.  She is breast feeding and planning on the nexplanon for contraception.   Physical Exam:  Filed Vitals:   01/14/14 0625  BP: 101/61  Pulse: 56  Temp: 97.9 F (36.6 C)  Resp: 18    General: alert, cooperative and no distress Lochia: appropriate Uterine Fundus: firm Incision: n/a DVT Evaluation: No evidence of DVT seen on physical exam.  Discharge Diagnoses: Term Pregnancy-delivered  Discharge Information: Date: 01/14/2014 Activity: unrestricted Diet: routine Medications: Ibuprofen Condition: stable Instructions: refer to practice specific booklet Discharge to: home   Newborn Data: Live born female  Birth Weight: 6 lb 12.3 oz (3070 g) APGAR: 9, 9  Home with mother.  Sharon Mcconnell 01/14/2014, 8:13 AM  I examined pt and agree with documentation above and resident plan of care. Eino Farber Paul Half, CNM

## 2014-01-14 NOTE — Discharge Instructions (Signed)

## 2014-02-22 ENCOUNTER — Ambulatory Visit (INDEPENDENT_AMBULATORY_CARE_PROVIDER_SITE_OTHER): Payer: Medicaid Other | Admitting: Obstetrics & Gynecology

## 2014-02-22 ENCOUNTER — Encounter: Payer: Self-pay | Admitting: Obstetrics & Gynecology

## 2014-02-22 VITALS — BP 104/69 | HR 82 | Temp 97.0°F | Wt 142.2 lb

## 2014-02-22 DIAGNOSIS — Z01812 Encounter for preprocedural laboratory examination: Secondary | ICD-10-CM

## 2014-02-22 LAB — POCT PREGNANCY, URINE: Preg Test, Ur: NEGATIVE

## 2014-02-22 NOTE — Progress Notes (Signed)
   Subjective:    Patient ID: Sharon Mcconnell, female    DOB: 10/25/1980, 33 y.o.   MRN: [id number]  HPI  33 yo M Asian P2 here for her 6 week pp visit and Nexplanon insertion. She denies problems. She is breastfeeding and the baby is doing well. She reports normal bowel and bladder function. She is having unprotected sex, as recently as a few days ago and denies dyspareunia. She denies any pp depression symptoms and scored a 0 on the depression test.  Review of Systems     Objective:   Physical Exam        Assessment & Plan:  PP-doing well Contraception- I have instructed her to be abstinent for the next 2 weeks and to come back so a Nexplanon can be placed. An interpretor was involved today.

## 2014-03-14 ENCOUNTER — Ambulatory Visit (INDEPENDENT_AMBULATORY_CARE_PROVIDER_SITE_OTHER): Payer: Medicaid Other | Admitting: Obstetrics & Gynecology

## 2014-03-14 ENCOUNTER — Encounter: Payer: Self-pay | Admitting: Obstetrics & Gynecology

## 2014-03-14 VITALS — BP 111/75 | HR 58 | Temp 97.4°F | Wt 141.7 lb

## 2014-03-14 DIAGNOSIS — Z30017 Encounter for initial prescription of implantable subdermal contraceptive: Secondary | ICD-10-CM

## 2014-03-14 DIAGNOSIS — Z30018 Encounter for initial prescription of other contraceptives: Secondary | ICD-10-CM

## 2014-03-14 LAB — POCT PREGNANCY, URINE: PREG TEST UR: NEGATIVE

## 2014-03-14 MED ORDER — IBUPROFEN 600 MG PO TABS: 600.0000 mg | ORAL_TABLET | Freq: Four times a day (QID) | ORAL | Status: DC | PRN

## 2014-03-14 MED ORDER — ETONOGESTREL 68 MG ~~LOC~~ IMPL
68.0000 mg | DRUG_IMPLANT | Freq: Once | SUBCUTANEOUS | Status: AC
  Administered 2014-03-14: 68 mg via SUBCUTANEOUS

## 2014-03-14 NOTE — Patient Instructions (Signed)
Etonogestrel implant What is this medicine? ETONOGESTREL (et oh noe JES trel) is a contraceptive (birth control) device. It is used to prevent pregnancy. It can be used for up to 3 years. This medicine may be used for other purposes; ask your health care provider or pharmacist if you have questions. COMMON BRAND NAME(S): Implanon, Nexplanon What should I tell my health care provider before I take this medicine? They need to know if you have any of these conditions: -abnormal vaginal bleeding -blood vessel disease or blood clots -cancer of the breast, cervix, or liver -depression -diabetes -gallbladder disease -headaches -heart disease or recent heart attack -high blood pressure -high cholesterol -kidney disease -liver disease -renal disease -seizures -tobacco smoker -an unusual or allergic reaction to etonogestrel, other hormones, anesthetics or antiseptics, medicines, foods, dyes, or preservatives -pregnant or trying to get pregnant -breast-feeding How should I use this medicine? This device is inserted just under the skin on the inner side of your upper arm by a health care professional. Talk to your pediatrician regarding the use of this medicine in children. Special care may be needed. Overdosage: If you think you've taken too much of this medicine contact a poison control center or emergency room at once. Overdosage: If you think you have taken too much of this medicine contact a poison control center or emergency room at once. NOTE: This medicine is only for you. Do not share this medicine with others. What if I miss a dose? This does not apply. What may interact with this medicine? Do not take this medicine with any of the following medications: -amprenavir -bosentan -fosamprenavir This medicine may also interact with the following medications: -barbiturate medicines for inducing sleep or treating seizures -certain medicines for fungal infections like ketoconazole and  itraconazole -griseofulvin -medicines to treat seizures like carbamazepine, felbamate, oxcarbazepine, phenytoin, topiramate -modafinil -phenylbutazone -rifampin -some medicines to treat HIV infection like atazanavir, indinavir, lopinavir, nelfinavir, tipranavir, ritonavir -St. John's wort This list may not describe all possible interactions. Give your health care provider a list of all the medicines, herbs, non-prescription drugs, or dietary supplements you use. Also tell them if you smoke, drink alcohol, or use illegal drugs. Some items may interact with your medicine. What should I watch for while using this medicine? This product does not protect you against HIV infection (AIDS) or other sexually transmitted diseases. You should be able to feel the implant by pressing your fingertips over the skin where it was inserted. Tell your doctor if you cannot feel the implant. What side effects may I notice from receiving this medicine? Side effects that you should report to your doctor or health care professional as soon as possible: -allergic reactions like skin rash, itching or hives, swelling of the face, lips, or tongue -breast lumps -changes in vision -confusion, trouble speaking or understanding -dark urine -depressed mood -general ill feeling or flu-like symptoms -light-colored stools -loss of appetite, nausea -right upper belly pain -severe headaches -severe pain, swelling, or tenderness in the abdomen -shortness of breath, chest pain, swelling in a leg -signs of pregnancy -sudden numbness or weakness of the face, arm or leg -trouble walking, dizziness, loss of balance or coordination -unusual vaginal bleeding, discharge -unusually weak or tired -yellowing of the eyes or skin Side effects that usually do not require medical attention (Report these to your doctor or health care professional if they continue or are bothersome.): -acne -breast pain -changes in  weight -cough -fever or chills -headache -irregular menstrual bleeding -itching, burning, and   vaginal discharge -pain or difficulty passing urine -sore throat This list may not describe all possible side effects. Call your doctor for medical advice about side effects. You may report side effects to FDA at 1-800-FDA-1088. Where should I keep my medicine? This drug is given in a hospital or clinic and will not be stored at home. NOTE: This sheet is a summary. It may not cover all possible information. If you have questions about this medicine, talk to your doctor, pharmacist, or health care provider.  2015, Elsevier/Gold Standard. (2012-02-07 15:37:45)  

## 2014-03-14 NOTE — Progress Notes (Signed)
Subjective:     Patient ID: Sharon Mcconnell, female   DOB: 10/13/1980, 33 y.o.   MRN: [id number]  HPI  CC: Presents for nexplanon insertion  Prior contraceptive management discussed, decided on nexplanon. Had unprotected sex prior to postpartum visit on 7/10 so rescheduled. Negative pregnancy test 7/10 and again today. Denies any sex since 7/10. Doing well otherwise without any complaints  Review of Systems     Objective:   Physical Exam BP 111/75  Pulse 58  Temp(Src) 97.4 F (36.3 C) (Oral)  Wt 141 lb 11.2 oz (64.275 kg)  Breastfeeding? Yes  General: NAD  PROCEDURE NOTE: Nexplanon insertion Patient given informed consent, signed copy in the chart.  Appropriate time out taken  Pregnancy test was negative.  The patient's  left arm was prepped and draped in the usual sterile fashion.. The ruler used to measure and mark the insertion area 8 cm from medial epicondyle of the elbow. Local anaesthesia obtained using 3 cc of 1% lidocaine with epinephrine. Nexplanon was inserted per manufacturer's directions. Less than 1 cc blood loss. The insertion site covered with antibiotic ointment and a pressure bandage to minimize bruising. There were no complications and the patient tolerated the procedure well.  Device information was given in handout form. Patient is informed the removal date will be in three years and package insert card filled out and given to her. Discussed continued abstinence or birth control for an additional 1 week    Plan:     Sharon Mcconnell is a 33 y.o. female postpartum  1, Nexplanon inserted today 03/14/2014

## 2014-03-15 ENCOUNTER — Encounter: Payer: Self-pay | Admitting: *Deleted

## 2014-04-10 ENCOUNTER — Encounter: Payer: Self-pay | Admitting: General Practice

## 2014-06-17 ENCOUNTER — Encounter: Payer: Self-pay | Admitting: Obstetrics & Gynecology

## 2014-07-30 ENCOUNTER — Encounter: Payer: Self-pay | Admitting: *Deleted

## 2016-06-30 ENCOUNTER — Emergency Department (HOSPITAL_COMMUNITY): Payer: Medicaid Other

## 2016-06-30 ENCOUNTER — Inpatient Hospital Stay (HOSPITAL_COMMUNITY)
Admission: EM | Admit: 2016-06-30 | Discharge: 2016-07-06 | DRG: 871 | Disposition: A | Discharge status: Home / Self Care | Payer: Medicaid Other | Attending: Student in an Organized Health Care Education/Training Program | Admitting: Student in an Organized Health Care Education/Training Program

## 2016-06-30 ENCOUNTER — Encounter (HOSPITAL_COMMUNITY): Payer: Self-pay | Admitting: *Deleted

## 2016-06-30 DIAGNOSIS — R739 Hyperglycemia, unspecified: Secondary | ICD-10-CM | POA: Diagnosis present

## 2016-06-30 DIAGNOSIS — Z87440 Personal history of urinary (tract) infections: Secondary | ICD-10-CM | POA: Diagnosis not present

## 2016-06-30 DIAGNOSIS — R6521 Severe sepsis with septic shock: Secondary | ICD-10-CM | POA: Diagnosis present

## 2016-06-30 DIAGNOSIS — N12 Tubulo-interstitial nephritis, not specified as acute or chronic: Secondary | ICD-10-CM | POA: Diagnosis present

## 2016-06-30 DIAGNOSIS — Z8611 Personal history of tuberculosis: Secondary | ICD-10-CM | POA: Diagnosis not present

## 2016-06-30 DIAGNOSIS — E872 Acidosis: Secondary | ICD-10-CM | POA: Diagnosis present

## 2016-06-30 DIAGNOSIS — A419 Sepsis, unspecified organism: Secondary | ICD-10-CM

## 2016-06-30 DIAGNOSIS — R509 Fever, unspecified: Secondary | ICD-10-CM | POA: Diagnosis present

## 2016-06-30 DIAGNOSIS — K8010 Calculus of gallbladder with chronic cholecystitis without obstruction: Secondary | ICD-10-CM | POA: Diagnosis present

## 2016-06-30 DIAGNOSIS — D696 Thrombocytopenia, unspecified: Secondary | ICD-10-CM | POA: Diagnosis not present

## 2016-06-30 DIAGNOSIS — D649 Anemia, unspecified: Secondary | ICD-10-CM | POA: Diagnosis present

## 2016-06-30 DIAGNOSIS — Z23 Encounter for immunization: Secondary | ICD-10-CM

## 2016-06-30 DIAGNOSIS — A4151 Sepsis due to Escherichia coli [E. coli]: Secondary | ICD-10-CM | POA: Diagnosis present

## 2016-06-30 DIAGNOSIS — B9689 Other specified bacterial agents as the cause of diseases classified elsewhere: Secondary | ICD-10-CM | POA: Diagnosis not present

## 2016-06-30 DIAGNOSIS — N179 Acute kidney failure, unspecified: Secondary | ICD-10-CM | POA: Diagnosis present

## 2016-06-30 DIAGNOSIS — E869 Volume depletion, unspecified: Secondary | ICD-10-CM | POA: Diagnosis present

## 2016-06-30 DIAGNOSIS — B9620 Unspecified Escherichia coli [E. coli] as the cause of diseases classified elsewhere: Secondary | ICD-10-CM | POA: Diagnosis not present

## 2016-06-30 DIAGNOSIS — Z8632 Personal history of gestational diabetes: Secondary | ICD-10-CM | POA: Diagnosis not present

## 2016-06-30 DIAGNOSIS — R6520 Severe sepsis without septic shock: Secondary | ICD-10-CM

## 2016-06-30 DIAGNOSIS — D6959 Other secondary thrombocytopenia: Secondary | ICD-10-CM | POA: Diagnosis present

## 2016-06-30 DIAGNOSIS — N10 Acute tubulo-interstitial nephritis: Secondary | ICD-10-CM | POA: Diagnosis not present

## 2016-06-30 LAB — URINALYSIS, ROUTINE W REFLEX MICROSCOPIC
Glucose, UA: 100 mg/dL — AB
Ketones, ur: NEGATIVE mg/dL
Nitrite: POSITIVE — AB
Specific Gravity, Urine: 1.017 (ref 1.005–1.030)
pH: 5.5 (ref 5.0–8.0)

## 2016-06-30 LAB — COMPREHENSIVE METABOLIC PANEL
ALBUMIN: 3 g/dL — AB (ref 3.5–5.0)
ALK PHOS: 87 U/L (ref 38–126)
ALT: 48 U/L (ref 14–54)
AST: 25 U/L (ref 15–41)
Anion gap: 15 (ref 5–15)
BUN: 36 mg/dL — AB (ref 6–20)
CALCIUM: 8.2 mg/dL — AB (ref 8.9–10.3)
CHLORIDE: 98 mmol/L — AB (ref 101–111)
CO2: 20 mmol/L — AB (ref 22–32)
CREATININE: 3.33 mg/dL — AB (ref 0.44–1.00)
GFR calc Af Amer: 19 mL/min — ABNORMAL LOW (ref >60)
GFR calc non Af Amer: 17 mL/min — ABNORMAL LOW (ref >60)
GLUCOSE: 208 mg/dL — AB (ref 65–99)
Potassium: 3 mmol/L — ABNORMAL LOW (ref 3.5–5.1)
SODIUM: 133 mmol/L — AB (ref 135–145)
Total Bilirubin: 2.2 mg/dL — ABNORMAL HIGH (ref 0.3–1.2)
Total Protein: 7.3 g/dL (ref 6.5–8.1)

## 2016-06-30 LAB — CBC
HCT: 34.1 % — ABNORMAL LOW (ref 36.0–46.0)
Hemoglobin: 11.9 g/dL — ABNORMAL LOW (ref 12.0–15.0)
MCH: 28.3 pg (ref 26.0–34.0)
MCHC: 34.9 g/dL (ref 30.0–36.0)
MCV: 81 fL (ref 78.0–100.0)
PLATELETS: 109 10*3/uL — AB (ref 150–400)
RBC: 4.21 MIL/uL (ref 3.87–5.11)
RDW: 12.8 % (ref 11.5–15.5)
WBC: 11.3 10*3/uL — ABNORMAL HIGH (ref 4.0–10.5)

## 2016-06-30 LAB — GLUCOSE, CAPILLARY: Glucose-Capillary: 131 mg/dL — ABNORMAL HIGH (ref 65–99)

## 2016-06-30 LAB — URINE MICROSCOPIC-ADD ON

## 2016-06-30 LAB — CBG MONITORING, ED: GLUCOSE-CAPILLARY: 155 mg/dL — AB (ref 65–99)

## 2016-06-30 LAB — I-STAT CG4 LACTIC ACID, ED
LACTIC ACID, VENOUS: 3.1 mmol/L — AB (ref 0.5–1.9)
Lactic Acid, Venous: 2.61 mmol/L (ref 0.5–1.9)

## 2016-06-30 LAB — I-STAT BETA HCG BLOOD, ED (MC, WL, AP ONLY): I-stat hCG, quantitative: <5 m[IU]/mL (ref <5)

## 2016-06-30 LAB — LIPASE, BLOOD: LIPASE: 21 U/L (ref 11–51)

## 2016-06-30 MED ORDER — SODIUM CHLORIDE 0.9 % IV BOLUS (SEPSIS)
1000.0000 mL | Freq: Once | INTRAVENOUS | Status: AC
  Administered 2016-06-30: 1000 mL via INTRAVENOUS

## 2016-06-30 MED ORDER — VANCOMYCIN HCL IN DEXTROSE 1-5 GM/200ML-% IV SOLN
1000.0000 mg | Freq: Once | INTRAVENOUS | Status: AC
  Administered 2016-06-30: 1000 mg via INTRAVENOUS
  Filled 2016-06-30: qty 200

## 2016-06-30 MED ORDER — INSULIN ASPART 100 UNIT/ML ~~LOC~~ SOLN
0.0000 [IU] | Freq: Three times a day (TID) | SUBCUTANEOUS | Status: DC
  Administered 2016-07-01: 2 [IU] via SUBCUTANEOUS

## 2016-06-30 MED ORDER — ACETAMINOPHEN 650 MG RE SUPP: 650.0000 mg | Freq: Four times a day (QID) | RECTAL | Status: DC | PRN

## 2016-06-30 MED ORDER — HEPARIN SODIUM (PORCINE) 5000 UNIT/ML IJ SOLN
5000.0000 [IU] | Freq: Three times a day (TID) | INTRAMUSCULAR | Status: DC
  Administered 2016-06-30 – 2016-07-01 (×2): 5000 [IU] via SUBCUTANEOUS
  Filled 2016-06-30 (×2): qty 1

## 2016-06-30 MED ORDER — DEXTROSE 5 % IV SOLN
1.0000 g | INTRAVENOUS | Status: DC
  Administered 2016-06-30: 1 g via INTRAVENOUS
  Filled 2016-06-30 (×2): qty 1

## 2016-06-30 MED ORDER — PIPERACILLIN-TAZOBACTAM 3.375 G IVPB 30 MIN
3.3750 g | Freq: Once | INTRAVENOUS | Status: AC
  Administered 2016-06-30: 3.375 g via INTRAVENOUS
  Filled 2016-06-30: qty 50

## 2016-06-30 MED ORDER — SODIUM CHLORIDE 0.9 % IV SOLN
Freq: Once | INTRAVENOUS | Status: AC
  Administered 2016-06-30: 11:00:00 via INTRAVENOUS

## 2016-06-30 MED ORDER — SODIUM CHLORIDE 0.9% FLUSH
3.0000 mL | Freq: Two times a day (BID) | INTRAVENOUS | Status: DC
  Administered 2016-07-01 – 2016-07-06 (×5): 3 mL via INTRAVENOUS

## 2016-06-30 MED ORDER — ONDANSETRON HCL 4 MG/2ML IJ SOLN
4.0000 mg | Freq: Four times a day (QID) | INTRAMUSCULAR | Status: DC | PRN
  Administered 2016-07-02: 4 mg via INTRAVENOUS
  Filled 2016-06-30: qty 2

## 2016-06-30 MED ORDER — INFLUENZA VAC SPLIT QUAD 0.5 ML IM SUSY
0.5000 mL | PREFILLED_SYRINGE | INTRAMUSCULAR | Status: AC
  Administered 2016-07-02: 0.5 mL via INTRAMUSCULAR
  Filled 2016-06-30: qty 0.5

## 2016-06-30 MED ORDER — ACETAMINOPHEN 325 MG PO TABS
650.0000 mg | ORAL_TABLET | Freq: Four times a day (QID) | ORAL | Status: DC | PRN
  Administered 2016-06-30 – 2016-07-01 (×2): 650 mg via ORAL
  Filled 2016-06-30 (×2): qty 2

## 2016-06-30 MED ORDER — POTASSIUM CHLORIDE IN NACL 20-0.9 MEQ/L-% IV SOLN
INTRAVENOUS | Status: AC
  Administered 2016-06-30: 22:00:00 via INTRAVENOUS
  Filled 2016-06-30 (×2): qty 1000

## 2016-06-30 MED ORDER — SODIUM CHLORIDE 0.9 % IV SOLN
INTRAVENOUS | Status: DC
  Administered 2016-06-30: 16:00:00 via INTRAVENOUS

## 2016-06-30 MED ORDER — ONDANSETRON HCL 4 MG PO TABS
4.0000 mg | ORAL_TABLET | Freq: Four times a day (QID) | ORAL | Status: DC | PRN
  Administered 2016-06-30: 4 mg via ORAL
  Filled 2016-06-30: qty 1

## 2016-06-30 MED ORDER — SODIUM CHLORIDE 0.9 % IV BOLUS (SEPSIS): 2000.0000 mL | Freq: Once | INTRAVENOUS | Status: DC

## 2016-06-30 MED ORDER — IOPAMIDOL (ISOVUE-300) INJECTION 61%
INTRAVENOUS | Status: AC
  Filled 2016-06-30: qty 100

## 2016-06-30 NOTE — ED Notes (Signed)
Attempted report x 1; name and call back number provided 

## 2016-06-30 NOTE — ED Notes (Signed)
Radiology at bedside

## 2016-06-30 NOTE — ED Notes (Signed)
Patient transported to CT 

## 2016-06-30 NOTE — ED Notes (Signed)
Dr. Rhunette CroftNanavati notified of pt elevated lactic

## 2016-06-30 NOTE — ED Notes (Addendum)
Pt refused blood draw. Heather Leeroy Bock& Chelsea, RNs made aware

## 2016-06-30 NOTE — Progress Notes (Signed)
Pharmacy Antibiotic Note  Wyona Almasang Chesterfield is a 35 y.o. female admitted on 06/30/2016 with sepsis / pyelonephritis.  Pharmacy has been consulted for cefepime dosing.  Day #1 of abx for sepsis / pyelonephritis. Tmax of 100.3, WBC 11.3. SCr 3.33, CrCl ~7215ml/min.  Plan: Start cefepime 1g IV Q24 Monitor clinical picture, renal function F/U C&S, abx deescalation / LOT   Height: 5\' 2"  (157.5 cm) Weight: 120 lb (54.4 kg) IBW/kg (Calculated) : 50.1  Temp (24hrs), Avg:99.7 F (37.6 C), Min:98.9 F (37.2 C), Max:100.3 F (37.9 C)   Recent Labs Lab 06/30/16 1059 06/30/16 1113 06/30/16 1552  WBC 11.3*  --   --   CREATININE 3.33*  --   --   LATICACIDVEN  --  2.61* 3.10*    Estimated Creatinine Clearance: 18.5 mL/min (by C-G formula based on SCr of 3.33 mg/dL (H)).    No Known Allergies  Antimicrobials this admission: Zosyn and Vanc x 1 in the ED Cefepime 11/15 >>   Dose adjustments this admission: n/a  Microbiology results: 11/15 BCx: sent 11/15 UCx: sent   Thank you for allowing pharmacy to be a part of this patient's care.  Armandina StammerBATCHELDER,Lesieli Bresee J 06/30/2016 8:00 PM

## 2016-06-30 NOTE — H&P (Signed)
Date: 06/30/2016               Patient Name:  Sharon Mcconnell MRN: [id number]  DOB: 11/04/1980 Age / Sex: 35 y.o., female, female   PCP: Mcconnell primary care provider on file.         Medical Service: Internal Medicine Teaching Service         Attending Physician: Dr. Tyson Aliasuncan Thomas Vincent, MD    First Contact: Dr. Nelson ChimesAmin Pager: [phone]  Second Contact: Dr. Dimple Caseyice Pager: [phone]       After Hours (After 5p/  First Contact Pager: [phone]  weekends / holidays): Second Contact Pager: [phone]   Chief Complaint: Right flank pain and vomiting.  History of Present Illness: [name], 35 y.o lady, With Mcconnell significant past medical history came to ED with an history of pain and right flank and abdominal, nausea, vomiting and fever and chills for 4 days. Patient states that she started getting some burning micturition and dysuria 4-5 days ago, later developed right flank pain radiating to her abdomen, 9/10 in intensity, associated with nausea and vomiting. She also complained of having some fever and chills for the last 3-4 days. She just tried Tylenol 3-4 times per day with minimum relief.  She states that she cannot comment on hematuria, as currently she is on her periods. She also complained of having diarrhea with 3 loose stools yesterday. She did not had any bowel movements since  Morning. She also endorses decreased in urine production since yesterday. She has Mcconnell appetite for the last 3-4 days. She also complained of some headache, denies any change in her vision, nasal congestion, cough or chest pain.  Patient speaks Burmese, history were obtained with the help of a friend from church.  Meds:  Current Meds  Medication Sig  . acetaminophen (TYLENOL) 325 MG tablet Take 650 mg by mouth every 6 (six) hours as needed for mild pain.     Allergies: Allergies as of 06/30/2016  . (Mcconnell Known Allergies)   Past Medical History:  Diagnosis Date  . Gestational diabetes 10/02/2013   1 hour 183, 2 hour 159     . Hx of tuberculosis    treated; on 05/20/13 pt denies any knowledge of TB hx  . Hx: UTI (urinary tract infection)     Family History: Mcconnell significant family history.  Social History: Patient lives with her husband and 2 children ages 14 and 2 year. Denies any smoking, alcohol or illicit drug use.  Review of Systems: A complete ROS was negative except as per HPI.   Physical Exam: Blood pressure (!) 73/44, pulse (!) 121, temperature 99.9 F (37.7 C), temperature source Rectal, resp. rate 21, height 5\' 2"  (1.575 m), weight 120 lb (54.4 kg), last menstrual period 06/29/2016, SpO2 95 %, currently breastfeeding.  Vitals:   06/30/16 1630 06/30/16 1645 06/30/16 1700 06/30/16 1715  BP: (!) 87/47 (!) 81/45 (!) 77/42 (!) 73/44  Pulse: (!) 122 (!) 123 (!) 125 (!) 121  Resp:    21  Temp:      TempSrc:      SpO2: 97% 95% 96% 95%  Weight:      Height:       General: Vital signs reviewed.  Patient is well-developed and well-nourished, in Mcconnell acute distress and cooperative with exam.  Head: Normocephalic and atraumatic. Eyes: EOMI, conjunctivae normal,  scleral icterus.  Neck: Supple, trachea midline, normal ROM, Mcconnell JVD, masses, thyromegaly, or carotid bruit present.  Cardiovascular: RRR, S1 normal, S2 normal, Mcconnell murmurs, gallops, or rubs. Pulmonary/Chest: Clear to auscultation bilaterally, Mcconnell wheezes, rales, or rhonchi. Abdominal: Soft, diffusely tender lower abdomen and right upper quadrant, right CVA tenderness, non-distended, BS +, Mcconnell masses, organomegaly, or guarding present.  Musculoskeletal: Mcconnell joint deformities, erythema, or stiffness, ROM full and nontender. Extremities: Mcconnell lower extremity edema bilaterally,  pulses symmetric and intact bilaterally. Mcconnell cyanosis or clubbing. Neurological: A&O x3, Strength is normal and symmetric bilaterally, cranial nerve II-XII are grossly intact, Mcconnell focal motor deficit, sensory intact to light touch bilaterally.  Skin: Warm, dry and intact. Mcconnell rashes or  erythema. Psychiatric: Normal mood and affect. speech and behavior is normal. Cognition and memory are normal.   Labs. CBC    Component Value Date/Time   WBC 11.3 (H) 06/30/2016 1059   RBC 4.21 06/30/2016 1059   HGB 11.9 (L) 06/30/2016 1059   HGB 11.3 09/20/2013   HCT 34.1 (L) 06/30/2016 1059   HCT 33 09/20/2013   PLT 109 (L) 06/30/2016 1059   PLT 234 09/20/2013   MCV 81.0 06/30/2016 1059   MCH 28.3 06/30/2016 1059   MCHC 34.9 06/30/2016 1059   RDW 12.8 06/30/2016 1059   CMP     Component Value Date/Time   NA 133 (L) 06/30/2016 1059   K 3.0 (L) 06/30/2016 1059   CL 98 (L) 06/30/2016 1059   CO2 20 (L) 06/30/2016 1059   GLUCOSE 208 (H) 06/30/2016 1059   BUN 36 (H) 06/30/2016 1059   CREATININE 3.33 (H) 06/30/2016 1059   CALCIUM 8.2 (L) 06/30/2016 1059   PROT 7.3 06/30/2016 1059   ALBUMIN 3.0 (L) 06/30/2016 1059   AST 25 06/30/2016 1059   ALT 48 06/30/2016 1059   ALKPHOS 87 06/30/2016 1059   BILITOT 2.2 (H) 06/30/2016 1059   GFRNONAA 17 (L) 06/30/2016 1059   GFRAA 19 (L) 06/30/2016 1059   Urinalysis    Component Value Date/Time   COLORURINE AMBER (A) 06/30/2016 1418   APPEARANCEUR HAZY (A) 06/30/2016 1418   LABSPEC 1.017 06/30/2016 1418   PHURINE 5.5 06/30/2016 1418   GLUCOSEU 100 (A) 06/30/2016 1418   HGBUR LARGE (A) 06/30/2016 1418   BILIRUBINUR SMALL (A) 06/30/2016 1418   KETONESUR NEGATIVE 06/30/2016 1418   PROTEINUR >300 (A) 06/30/2016 1418   UROBILINOGEN 0.2 01/08/2014 1516   NITRITE POSITIVE (A) 06/30/2016 1418   LEUKOCYTESUR MODERATE (A) 06/30/2016 1418   Urine microscopic-add on  Order: [id number]  Status:  Final result Visible to patient:  Mcconnell (Not Released) Next appt:  None    Ref Range & Units 14:18 67yr ago   Squamous Epithelial / LPF NONE SEEN 0-5   FEWR     WBC, UA 0 - 5 WBC/hpf TOO NUMEROUS TO COUNT  <3 WBC/hpf" class="[id number] [id number]"> 21-50R    RBC / HPF 0 - 5 RBC/hpf TOO NUMEROUS TO COUNT  <3 RBC/hpf" class="z1wb [id number]"> 0-2R     Bacteria, UA NONE SEEN MANY   MANYR     Casts NEGATIVE GRANULAR CAST           Beta hCG. <0.05 Lactic acid. 2.61 Lipase. 21  EKG:  Date/Time:                  Wednesday June 30 2016 11:20:41 EST Ventricular Rate:   102 PR Interval:                        QRS Duration:  85 QT Interval:                      344 QTC Calculation:    449 R Axis:                         74 Text Interpretation:  Sinus tachycardia Abnormal R-wave progression, early transition Baseline wander in lead(s) I II aVR aVL aVF V1 V2 V3 V4 V5   CXR: FINDINGS: The heart size and mediastinal contours are within normal limits. Both lungs are clear. The visualized skeletal structures are unremarkable.  IMPRESSION: Mcconnell active disease.  CT abdomen and pelvis. FINDINGS: Lower chest:  Mild right basilar atelectasis.  Hepatobiliary: Mcconnell focal liver abnormality. Cholelithiasis. Mcconnell changes of cholecystitis.  Pancreas: Unremarkable.  Spleen: Unremarkable.  Adrenals/Urinary Tract: Negative adrenals. Expanded right kidney with heterogeneous cortex. There is fat infiltration around the kidney and upper ureter. Mcconnell hydronephrosis or nephrolithiasis. The right renal vein is not hyperdense compared to the left. The left kidney is also prominent in size and mildly indistinct.  Stomach/Bowel:  Mcconnell obstruction. Mcconnell appendicitis.  Vascular/Lymphatic: Mcconnell acute vascular abnormality. Mcconnell mass or adenopathy.  Reproductive:Mcconnell pathologic findings.  Other: Mcconnell ascites or pneumoperitoneum.  Musculoskeletal: Mcconnell acute abnormalities.  IMPRESSION: 1. Large and heterogeneous right kidney without hydronephrosis, likely advanced pyelonephritis in this setting. Left kidney also appears large and may be affected to a lesser degree. 2. Cholelithiasis. 3. Normal appendix.  Assessment & Plan by Problem:  [name], 35 y.o lady, With Mcconnell significant past medical history came to ED with an history of pain and right  flank and abdominal, nausea, vomiting and fever and chills for 4 days.  Pyelonephritis/sepsis. Her urine analysis was positive for leukocytes, nitrates and many bacteria. CT abdomen shows enlarged right kidney without hydronephrosis, consistent with pyelonephritis.  She was hypotensive with blood pressure in 70s on arrival to ED. Responded to IV fluids. She was still mildly hypotensive when seen is a systolic and high 80s. She was given a dose of Zosyn and vancomycin in ED. Blood and urine culture results are pending. -Admit to step down. -Continue IV fluid resuscitation. -Cefepime. We will readjust antibiotics once culture and sensitivity reports become available.  Hyperbilirubinemia. She is having isolated hyperbilirubinemia up to 2.2 and mild scleral icterus. She is having asymptomatic cholelithiasis on CT abdomen. -Hepatitis panel.  Hyperglycemia. Her blood sugar apparently fasting is above 200. She had an history of  gestational diabetes. -Monitor CBG. -We'll check A1c. -Sliding scale if needed.  Code. Full Diet. Liquid and then advance as tolerated. DVT prophylaxis. Heparin  Dispo: Admit patient to Inpatient with expected length of stay greater than 2 midnights.  Signed: Arnetha CourserSumayya Jazalyn Mondor, MD 06/30/2016, 5:28 PM  Pager: [phone]

## 2016-06-30 NOTE — ED Notes (Signed)
Dr. yelverton at bedside. 

## 2016-06-30 NOTE — ED Notes (Signed)
Dr. Ranae PalmsYelverton notified of pts elevated lactic acid

## 2016-06-30 NOTE — Progress Notes (Signed)
Dr. Earlene PlaterWallace and I went to re-evaluate the patients status at approximately 11:30 pm this evening with the assistance of a Translator (Burmese). She appears comfortable and was in no distress. Reports she's been able to sleep comfortably and reports she's hungry and requests fruit. Pt reports she feels better since admission and inquired if she would be d/c tonight or tomorrow. It was explained that she has a kidney infection that requires several days of IV antibiotics with fluid replacement as well. She reported understanding.  The patient has remained hypotensive, albeit improved, despite receiving 4L ns + maintenance fluids. BP's initially 70's/40's and tachycardic at 129 however vitals currently 80/50 with rate of 95. She does appear to be responding slowly to current therapy however will request the assistance of CCM for their advice in the management of this patient. Update: CCM suggests another 1L bolus. Will administer and f/u response.

## 2016-06-30 NOTE — H&P (Signed)
  Date: 06/30/2016  Patient name: Sharon Mcconnell  Medical record number: [id number]  Date of birth: 10/03/1980   I have seen and evaluated Sharon Mcconnell and discussed their care with the Residency Team.  35 [place] from [place], in [place] for last 5 years, can't remember her last MD visit, comes to ED with 4 days of dysuria. Over last 24-48 h she developed n/v as well as abd pain.  Currently she feels hot, has had chills earlier today. Headaches as well.   PMHx, Fam Hx, and/or Soc Hx : per h/o's note.  Denies allergies.   Vitals:   06/30/16 1700 06/30/16 1715  BP: (!) 77/42 (!) 73/44  Pulse: (!) 125 (!) 121  Resp:  21  Temp:     Eyes- icterus, mild.  Mouth- without lesion Neck- nontender, no LAN.  Chest- cta CV- tachycardia Chest-  Tachypnea Abd- BS+, soft, non-tender. No flank pain.  Extr- no edema.   Labs- CT scan: 1. Large and heterogeneous right kidney without hydronephrosis, likely advanced pyelonephritis in this setting. Left kidney also appears large and may be affected to a lesser degree. 2. Cholelithiasis. 3. Normal appendix.  Cr 3.33 Glc 208 T bil 2.2 Lactate 3.10 WBC 11.3 UA- TNTC WBC, TNTC RBC, Glc 100.   Assessment and Plan: I have seen and evaluated the patient as outlined above. I agree with the formulated Assessment and Plan as detailed in the residents' note, with the following changes:   Sepsis Pyelonephritis Elevated Bili Hyperglycemia ARF  Will start her on cefepime (ceftraixone may increase her Bili).  There is not good evidence for "double coverage" of gram negative rods.  She does not need vanco, she has not had recent instrumentation.  Await her UCx, BCx  Check HIV, Acute hepatitis panel (Hep B endemic area?).   Check HgB A1C She had previous gestational diabetes prev, she is at risk.   Follow up her hemodynamics with aggressive hydration.  Follow up her Cr with aggressive hydration.  Ginnie SmartJeffrey C Charniece Venturino, MD 11/15/20175:35 PM

## 2016-06-30 NOTE — ED Provider Notes (Signed)
[facility] DEPT Provider Note   CSN: [id number] Arrival date & time: 06/30/16  1039     History   Chief Complaint Chief Complaint  Patient presents with  . Abdominal Pain  . Emesis    HPI Sharon Mcconnell is a 35 y.o. female.  HPI She's non-English-speaking. Has family member at bedside to translate. Presents with 4 days of dysuria, fever, chills, nausea, vomiting and right abdominal/flank pain. States she has irregular periods and is currently on her cycle. Denies discharge. Right flank pain is worse with deep inspiration. Denies cough.  Past Medical History:  Diagnosis Date  . Gestational diabetes 10/02/2013   1 hour 183, 2 hour 159   . Hx of tuberculosis    treated; on 05/20/13 pt denies any knowledge of TB hx  . Hx: UTI (urinary tract infection)     Patient Active Problem List   Diagnosis Date Noted  . Pyelonephritis 06/30/2016  . Pregnancy 01/12/2014  . Supervision of high-risk pregnancy 10/08/2013  . Gestational diabetes 10/02/2013  . Language barrier 10/02/2013  . 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 Hx of tuberculosis 10/02/2013  . Late prenatal care 10/02/2013  . Exposure to second hand smoke 10/02/2013    Past Surgical History:  Procedure Laterality Date  . NO PAST SURGERIES      OB History    Gravida Para Term Preterm AB Living   2 2 2  0 0 2   SAB TAB Ectopic Multiple Live Births   0 0 0 0 2       Home Medications    Prior to Admission medications   Medication Sig Start Date End Date Taking? Authorizing Provider  ACCU-CHEK FASTCLIX LANCETS MISC 1 each by Percutaneous route 4 (four) times daily. 10/08/13   Woodroe Mode, MD  Blood Glucose Monitoring Suppl (ACCU-CHEK AVIVA PLUS) W/DEVICE KIT 1 each by Does not apply route 4 (four) times daily. 10/29/13   Woodroe Mode, MD  glucose blood test strip Use as instructed 10/08/13   Woodroe Mode, MD  ibuprofen (ADVIL,MOTRIN) 600 MG tablet Take 1 tablet (600 mg total) by mouth every 6 (six) hours as needed. 03/14/14    Leone Brand, MD  Prenatal Vit-Fe Fumarate-FA (PRENATAL MULTIVITAMIN) TABS tablet Take 1 tablet by mouth daily. 11/12/13   Allen Norris, MD    Family History History reviewed. No pertinent family history.  Social History Social History  Substance Use Topics  . Smoking status: Never Smoker  . Smokeless tobacco: Never Used  . Alcohol use No     Allergies   Patient has no known allergies.   Review of Systems Review of Systems  Constitutional: Positive for chills, fatigue and fever.  Respiratory: Positive for shortness of breath. Negative for cough.   Cardiovascular: Negative for chest pain, palpitations and leg swelling.  Gastrointestinal: Positive for abdominal pain, nausea and vomiting. Negative for blood in stool and constipation.  Genitourinary: Positive for dysuria, flank pain and vaginal bleeding. Negative for difficulty urinating, frequency, hematuria, pelvic pain and vaginal discharge.  Musculoskeletal: Positive for back pain and myalgias. Negative for neck pain and neck stiffness.  Skin: Negative for rash and wound.  Neurological: Negative for dizziness, weakness, light-headedness, numbness and headaches.  All other systems reviewed and are negative.    Physical Exam Updated Vital Signs BP (!) 82/58   Pulse 91   Temp 98.9 F (37.2 C) (Oral)   Resp 19   Ht 5' 2"  (1.575 m)   Wt 120 lb (54.4  kg)   LMP 06/29/2016   SpO2 97%   BMI 21.95 kg/m   Physical Exam  Constitutional: She is oriented to person, place, and time. She appears well-developed and well-nourished.  HENT:  Head: Normocephalic and atraumatic.  Mouth/Throat: Oropharynx is clear and moist. No oropharyngeal exudate.  Eyes: EOM are normal. Pupils are equal, round, and reactive to light. Scleral icterus is present.  Neck: Normal range of motion. Neck supple. No JVD present.  Cardiovascular: Regular rhythm.   Tachycardia  Pulmonary/Chest: Effort normal and breath sounds normal.  Abdominal: Soft.  Bowel sounds are normal. There is tenderness ( tenderness to palpation diffusely but appears). There is no rebound and no guarding.  Musculoskeletal: Normal range of motion. She exhibits no edema or tenderness.  Right CVA tenderness. No midline thoracic or lumbar tenderness. No lower sugary swelling or asymmetry. Distal pulses are intact.  Neurological: She is alert and oriented to person, place, and time.  Moves all extremities without deficit. Sensation is fully intact.  Skin: Skin is warm and dry. Capillary refill takes less than 2 seconds. No rash noted. No erythema.  Psychiatric: She has a normal mood and affect. Her behavior is normal.  Nursing note and vitals reviewed.    ED Treatments / Results  Labs (all labs ordered are listed, but only abnormal results are displayed) Labs Reviewed  COMPREHENSIVE METABOLIC PANEL - Abnormal; Notable for the following:       Result Value   Sodium 133 (*)    Potassium 3.0 (*)    Chloride 98 (*)    CO2 20 (*)    Glucose, Bld 208 (*)    BUN 36 (*)    Creatinine, Ser 3.33 (*)    Calcium 8.2 (*)    Albumin 3.0 (*)    Total Bilirubin 2.2 (*)    GFR calc non Af Amer 17 (*)    GFR calc Af Amer 19 (*)    All other components within normal limits  CBC - Abnormal; Notable for the following:    WBC 11.3 (*)    Hemoglobin 11.9 (*)    HCT 34.1 (*)    Platelets 109 (*)    All other components within normal limits  I-STAT CG4 LACTIC ACID, ED - Abnormal; Notable for the following:    Lactic Acid, Venous 2.61 (*)    All other components within normal limits  CBG MONITORING, ED - Abnormal; Notable for the following:    Glucose-Capillary 155 (*)    All other components within normal limits  CULTURE, BLOOD (ROUTINE X 2)  CULTURE, BLOOD (ROUTINE X 2)  URINE CULTURE  LIPASE, BLOOD  URINALYSIS, ROUTINE W REFLEX MICROSCOPIC (NOT AT [facility])  I-STAT BETA HCG BLOOD, ED ([facility], [facility], [facility] ONLY)  I-STAT BETA HCG BLOOD, ED ([facility], [facility], [facility] ONLY)    EKG  EKG  Interpretation  Date/Time:  Wednesday June 30 2016 11:20:41 EST Ventricular Rate:  102 PR Interval:    QRS Duration: 85 QT Interval:  344 QTC Calculation: 449 R Axis:   74 Text Interpretation:  Sinus tachycardia Abnormal R-wave progression, early transition Baseline wander in lead(s) I II aVR aVL aVF V1 V2 V3 V4 V5 Confirmed by Patrycja Mumpower  MD, Nakhi Choi ([id number]) on 06/30/2016 12:39:38 PM       Radiology Ct Abdomen Pelvis Wo Contrast  Result Date: 06/30/2016 CLINICAL DATA:  Right upper quadrant and flank pain for 4 days. Hypotension. CBC pending. EXAM: CT ABDOMEN AND PELVIS WITHOUT CONTRAST TECHNIQUE: Multidetector CT imaging  of the abdomen and pelvis was performed following the standard protocol without IV contrast. COMPARISON:  None. FINDINGS: Lower chest:  Mild right basilar atelectasis. Hepatobiliary: No focal liver abnormality.Cholelithiasis. No changes of cholecystitis. Pancreas: Unremarkable. Spleen: Unremarkable. Adrenals/Urinary Tract: Negative adrenals. Expanded right kidney with heterogeneous cortex. There is fat infiltration around the kidney and upper ureter. No hydronephrosis or nephrolithiasis. The right renal vein is not hyperdense compared to the left. The left kidney is also prominent in size and mildly indistinct. Stomach/Bowel:  No obstruction. No appendicitis. Vascular/Lymphatic: No acute vascular abnormality. No mass or adenopathy. Reproductive:No pathologic findings. Other: No ascites or pneumoperitoneum. Musculoskeletal: No acute abnormalities. IMPRESSION: 1. Large and heterogeneous right kidney without hydronephrosis, likely advanced pyelonephritis in this setting. Left kidney also appears large and may be affected to a lesser degree. 2. Cholelithiasis. 3. Normal appendix. Electronically Signed   By: Monte Fantasia M.D.   On: 06/30/2016 12:00   Dg Chest Port 1 View  Result Date: 06/30/2016 CLINICAL DATA:  Sepsis EXAM: PORTABLE CHEST 1 VIEW COMPARISON:  04/20/2011  FINDINGS: The heart size and mediastinal contours are within normal limits. Both lungs are clear. The visualized skeletal structures are unremarkable. IMPRESSION: No active disease. Electronically Signed   By: Kathreen Devoid   On: 06/30/2016 11:29    Procedures Procedures (including critical care time)  Medications Ordered in ED Medications  sodium chloride 0.9 % bolus 2,000 mL (0 mLs Intravenous Hold 06/30/16 1122)  piperacillin-tazobactam (ZOSYN) IVPB 3.375 g (3.375 g Intravenous New Bag/Given 06/30/16 1231)  vancomycin (VANCOCIN) IVPB 1000 mg/200 mL premix (1,000 mg Intravenous New Bag/Given 06/30/16 1230)  iopamidol (ISOVUE-300) 61 % injection (not administered)  sodium chloride 0.9 % bolus 1,000 mL (not administered)  0.9 %  sodium chloride infusion ( Intravenous New Bag/Given 06/30/16 1116)  0.9 %  sodium chloride infusion ( Intravenous New Bag/Given 06/30/16 1116)   CRITICAL CARE Performed by: Lita Mains, Kmari Brian Total critical care time: 61mnutes Critical care time was exclusive of separately billable procedures and treating other patients. Critical care was necessary to treat or prevent imminent or life-threatening deterioration. Critical care was time spent personally by me on the following activities: development of treatment plan with patient and/or surrogate as well as nursing, discussions with consultants, evaluation of patient's response to treatment, examination of patient, obtaining history from patient or surrogate, ordering and performing treatments and interventions, ordering and review of laboratory studies, ordering and review of radiographic studies, pulse oximetry and re-evaluation of patient's condition.  Initial Impression / Assessment and Plan / ED Course  I have reviewed the triage vital signs and the nursing notes.  Pertinent labs & imaging results that were available during my care of the patient were reviewed by me and considered in my medical decision making (see  chart for details).  Clinical Course    Presents hypertensive and tachycardic. Bedside fast without evidence of free fluid. Initially she is sepsis protocol. Suspect pyelonephritis versus gallbladder disease. CT with evidence of right-sided pyelonephritis. Patient's heart rate and blood pressures improved modestly after IV fluids. Denies any dizziness or lightheadedness. Presumed new onset acute kidney injury. We'll give another liter of IV fluids. Discussed with internal medicine resident will see patient in emergency department and admit to step down bed. Final Clinical Impressions(s) / ED Diagnoses   Final diagnoses:  Pyelonephritis  Severe sepsis ([facility]    New Prescriptions New Prescriptions   No medications on file     DJulianne Rice MD 06/30/16 1240

## 2016-06-30 NOTE — ED Notes (Signed)
Unable to obtain blood cultures with iv start.

## 2016-06-30 NOTE — ED Notes (Signed)
Attempted report x 2; name and call back number provided 

## 2016-06-30 NOTE — ED Triage Notes (Signed)
Pt is hypotensive at triage. Reports right side pain x 4 days that radiates across her abd. Initially had pain with urination. Having n/v/d.

## 2016-07-01 DIAGNOSIS — R739 Hyperglycemia, unspecified: Secondary | ICD-10-CM

## 2016-07-01 DIAGNOSIS — B9620 Unspecified Escherichia coli [E. coli] as the cause of diseases classified elsewhere: Secondary | ICD-10-CM

## 2016-07-01 DIAGNOSIS — N179 Acute kidney failure, unspecified: Secondary | ICD-10-CM

## 2016-07-01 DIAGNOSIS — D649 Anemia, unspecified: Secondary | ICD-10-CM

## 2016-07-01 DIAGNOSIS — N10 Acute tubulo-interstitial nephritis: Secondary | ICD-10-CM

## 2016-07-01 DIAGNOSIS — A419 Sepsis, unspecified organism: Secondary | ICD-10-CM

## 2016-07-01 DIAGNOSIS — D696 Thrombocytopenia, unspecified: Secondary | ICD-10-CM

## 2016-07-01 LAB — BLOOD CULTURE ID PANEL (REFLEXED)
ACINETOBACTER BAUMANNII: NOT DETECTED
CANDIDA KRUSEI: NOT DETECTED
CANDIDA TROPICALIS: NOT DETECTED
CARBAPENEM RESISTANCE: NOT DETECTED
Candida albicans: NOT DETECTED
Candida glabrata: NOT DETECTED
Candida parapsilosis: NOT DETECTED
Enterobacter cloacae complex: NOT DETECTED
Enterobacteriaceae species: DETECTED — AB
Enterococcus species: NOT DETECTED
Escherichia coli: DETECTED — AB
HAEMOPHILUS INFLUENZAE: NOT DETECTED
KLEBSIELLA OXYTOCA: NOT DETECTED
Klebsiella pneumoniae: NOT DETECTED
LISTERIA MONOCYTOGENES: NOT DETECTED
Methicillin resistance: NOT DETECTED
NEISSERIA MENINGITIDIS: NOT DETECTED
PROTEUS SPECIES: NOT DETECTED
Pseudomonas aeruginosa: NOT DETECTED
SERRATIA MARCESCENS: NOT DETECTED
STAPHYLOCOCCUS SPECIES: NOT DETECTED
STREPTOCOCCUS PYOGENES: NOT DETECTED
STREPTOCOCCUS SPECIES: NOT DETECTED
Staphylococcus aureus (BCID): NOT DETECTED
Streptococcus agalactiae: NOT DETECTED
Streptococcus pneumoniae: NOT DETECTED
Vancomycin resistance: NOT DETECTED

## 2016-07-01 LAB — LACTIC ACID, PLASMA
LACTIC ACID, VENOUS: 1.4 mmol/L (ref 0.5–1.9)
Lactic Acid, Venous: 2 mmol/L (ref 0.5–1.9)
Lactic Acid, Venous: 1.3 mmol/L (ref 0.5–1.9)

## 2016-07-01 LAB — GLUCOSE, CAPILLARY
GLUCOSE-CAPILLARY: 100 mg/dL — AB (ref 65–99)
GLUCOSE-CAPILLARY: 165 mg/dL — AB (ref 65–99)
Glucose-Capillary: 97 mg/dL (ref 65–99)
Glucose-Capillary: 150 mg/dL — ABNORMAL HIGH (ref 65–99)

## 2016-07-01 LAB — CBC
HEMATOCRIT: 31.4 % — AB (ref 36.0–46.0)
Hemoglobin: 10.9 g/dL — ABNORMAL LOW (ref 12.0–15.0)
MCH: 27.9 pg (ref 26.0–34.0)
MCHC: 34.7 g/dL (ref 30.0–36.0)
MCV: 80.5 fL (ref 78.0–100.0)
Platelets: 73 10*3/uL — ABNORMAL LOW (ref 150–400)
RBC: 3.9 MIL/uL (ref 3.87–5.11)
RDW: 13.3 % (ref 11.5–15.5)
WBC: 6.8 10*3/uL (ref 4.0–10.5)

## 2016-07-01 LAB — COMPREHENSIVE METABOLIC PANEL
ALT: 29 U/L (ref 14–54)
ANION GAP: 9 (ref 5–15)
AST: 22 U/L (ref 15–41)
Albumin: 2 g/dL — ABNORMAL LOW (ref 3.5–5.0)
Alkaline Phosphatase: 68 U/L (ref 38–126)
BILIRUBIN TOTAL: 2.3 mg/dL — AB (ref 0.3–1.2)
BUN: 41 mg/dL — AB (ref 6–20)
CO2: 17 mmol/L — ABNORMAL LOW (ref 22–32)
Calcium: 6.6 mg/dL — ABNORMAL LOW (ref 8.9–10.3)
Chloride: 110 mmol/L (ref 101–111)
Creatinine, Ser: 3.89 mg/dL — ABNORMAL HIGH (ref 0.44–1.00)
GFR calc Af Amer: 16 mL/min — ABNORMAL LOW (ref >60)
GFR, EST NON AFRICAN AMERICAN: 14 mL/min — AB (ref >60)
Glucose, Bld: 160 mg/dL — ABNORMAL HIGH (ref 65–99)
POTASSIUM: 3.9 mmol/L (ref 3.5–5.1)
Sodium: 136 mmol/L (ref 135–145)
TOTAL PROTEIN: 6.2 g/dL — AB (ref 6.5–8.1)

## 2016-07-01 LAB — MRSA PCR SCREENING: MRSA BY PCR: NEGATIVE

## 2016-07-01 LAB — HIV ANTIBODY (ROUTINE TESTING W REFLEX): HIV Screen 4th Generation wRfx: NONREACTIVE

## 2016-07-01 LAB — MAGNESIUM: MAGNESIUM: 1.7 mg/dL (ref 1.7–2.4)

## 2016-07-01 LAB — PREGNANCY, URINE: PREG TEST UR: NEGATIVE

## 2016-07-01 MED ORDER — SODIUM CHLORIDE 0.9 % IV BOLUS (SEPSIS)
1000.0000 mL | Freq: Once | INTRAVENOUS | Status: AC
  Administered 2016-07-01: 1000 mL via INTRAVENOUS

## 2016-07-01 MED ORDER — ACETAMINOPHEN 650 MG RE SUPP: 650.0000 mg | Freq: Four times a day (QID) | RECTAL | Status: DC | PRN

## 2016-07-01 MED ORDER — SODIUM CHLORIDE 0.9 % IV SOLN
INTRAVENOUS | Status: DC
Start: 2016-07-01 — End: 2016-07-02
  Administered 2016-07-01 – 2016-07-02 (×3): via INTRAVENOUS

## 2016-07-01 MED ORDER — SODIUM CHLORIDE 0.9 % IV SOLN
1.0000 g | Freq: Two times a day (BID) | INTRAVENOUS | Status: DC
  Administered 2016-07-01 (×2): 1 g via INTRAVENOUS
  Filled 2016-07-01 (×4): qty 1

## 2016-07-01 MED ORDER — POTASSIUM CHLORIDE IN NACL 20-0.9 MEQ/L-% IV SOLN
INTRAVENOUS | Status: DC
  Filled 2016-07-01: qty 1000

## 2016-07-01 MED ORDER — ACETAMINOPHEN 500 MG PO TABS
1000.0000 mg | ORAL_TABLET | Freq: Four times a day (QID) | ORAL | Status: DC | PRN
  Administered 2016-07-01 – 2016-07-04 (×11): 1000 mg via ORAL
  Filled 2016-07-01 (×11): qty 2

## 2016-07-01 NOTE — Progress Notes (Signed)
   Subjective: This morning, she reported that she felt very warm and has been having chills. She also reports back/right flank pain. No vomiting. She has been having loose stools which have been ongoing since before she came to the hospital.   Objective:  Vital signs in last 24 hours: Vitals:   07/01/16 1159 07/01/16 1521 07/01/16 1600 07/01/16 1612  BP: 91/62   (!) 102/59  Pulse: (!) 117   (!) 128  Resp: (!) 29   (!) 27  Temp: 98.4 F (36.9 C) 99.4 F (37.4 C) 100.2 F (37.9 C)   TempSrc: Oral Oral Oral   SpO2: 93%   94%  Weight:      Height:       General Apperance: NAD HEENT: Normocephalic, atraumatic, anicteric sclera Neck: Supple, trachea midline Lungs: Clear to auscultation bilaterally. No wheezes, rhonchi or rales.  Heart: Tachycardic, no murmur/rub/gallop Abdomen: Soft, tenderness in right flank, nondistended, no rebound/guarding Extremities: Warm and well perfused, no edema Skin: No rashes or lesions Neurologic: Alert and interactive. No gross deficits.   Assessment/Plan: 35 year old woman presented with fevers/chills, nausea, vomiting, and right flank pain.  Pyelonephritis, E. coli Bacteremia: She had hypotension overnight that responded to boluses of Normal Saline. She received a total of 3L last night. This morning, she had rigors and remained tachycardic. On recheck of her blood pressure, her MAP was below 65. Urine culture with 20k colonies/ml E. Coli. Blood cultures x 2 with E. Coli. Leukocytosis improved to 6.8 from 11.3. -Broaden her abx by changing cefepime to meropenem  -Follow up sensitivities -Continue NS@125ml /hr. Continue to give NS boluses for hypotension  Hyperbilirubinemia: Likely related to sepsis. May have underlying Gilbert disease. -f/u hepatitis panel  Hyperglycemia: Follow up hgb A1c. Continue SSI.  AKI: Cr 3.33 on admission. Likely pre-renal and may have some ATN. Only 350ml of UOP recorded, though suspect this may not be  accurate. -Strict in/out -Continue to trend creatinine -Continue IV fluids  Thrombocytopenia: Plt 73 this morning from 109 on admission. Previously was 211 in 2015. Likely caused by bacterial infection/sepsis. Discontinue pharmacologic VTE ppx since plts below 100k. Start SCDs. Will continue to follow.  Chronic normocytic anemia: Hgb 10.9. Baseline is around 10.5. Unknown renal baseline, so she could have CKD. Will need to further evaluate when above problems improved.  FEN: Regular diet, NS VTE ppx: SCDs Code status: FULL  Dispo: Anticipated discharge in approximately 2-3 day(s).   Lora PaulaJennifer T Krall, MD 07/01/2016, 4:24 PM Pager: [phone]

## 2016-07-01 NOTE — Progress Notes (Addendum)
Pharmacy Antibiotic Note  Sharon Mcconnell is a 35 y.o. female admitted on 06/30/2016 with sepsis and pyelonephritis.  Pharmacy has been consulted for meropenem dosing. Pt initially started on empiric cefepime, however, per medical team, not responding as expected so will broaden coverage. CrCl ~17 ml/min, WBC now wnl, LA down to 1.3, AF; however, patient has remained hypotensive 90s-100s/60s.   Plan: D/c cefepime Start meropenem 1000mg  q12h F/u clinical picture, fever curve, WBC count, LA, renal function  Will need further adjustment if CrCl continues to fall or improves drastically.   Height: 5\' 2"  (157.5 cm) Weight: 145 lb 15.1 oz (66.2 kg) IBW/kg (Calculated) : 50.1  Temp (24hrs), Avg:100.3 F (37.9 C), Min:98.2 F (36.8 C), Max:102.9 F (39.4 C)   Recent Labs Lab 06/30/16 1059 06/30/16 1113 06/30/16 1552 06/30/16 2318 07/01/16 0252 07/01/16 0850  WBC 11.3*  --   --   --  6.8  --   CREATININE 3.33*  --   --   --  3.89*  --   LATICACIDVEN  --  2.61* 3.10* 1.4 2.0* 1.3    Estimated Creatinine Clearance: 17.8 mL/min (by C-G formula based on SCr of 3.89 mg/dL (H)).    No Known Allergies  Antimicrobials this admission: Zosyn 11/15 x 1 Vancomycin 11/15 x 1 Cefepime 11/15 >> 11/16 Meropenem 11/16>>  Dose adjustments this admission: n/a  Microbiology results: 11/15 2/2 BCx: GNR - BCID with E.coli 11/15 UCx: sent  11/16 MRSA PCR negative  Thank you for allowing pharmacy to be a part of this patient's care.  Allena Katzaroline E Saige Busby, Pharm.D. PGY1 Pharmacy Resident 11/16/201710:09 AM Pager [phone]

## 2016-07-01 NOTE — Progress Notes (Signed)
PHARMACY - PHYSICIAN COMMUNICATION CRITICAL VALUE ALERT - BLOOD CULTURE IDENTIFICATION (BCID)  Results for orders placed or performed during the hospital encounter of 06/30/16  Blood Culture ID Panel (Reflexed) (Collected: 06/30/2016 12:13 PM)  Result Value Ref Range   Enterococcus species NOT DETECTED NOT DETECTED   Vancomycin resistance NOT DETECTED NOT DETECTED   Listeria monocytogenes NOT DETECTED NOT DETECTED   Staphylococcus species NOT DETECTED NOT DETECTED   Staphylococcus aureus NOT DETECTED NOT DETECTED   Methicillin resistance NOT DETECTED NOT DETECTED   Streptococcus species NOT DETECTED NOT DETECTED   Streptococcus agalactiae NOT DETECTED NOT DETECTED   Streptococcus pneumoniae NOT DETECTED NOT DETECTED   Streptococcus pyogenes NOT DETECTED NOT DETECTED   Acinetobacter baumannii NOT DETECTED NOT DETECTED   Enterobacteriaceae species DETECTED (A) NOT DETECTED   Enterobacter cloacae complex NOT DETECTED NOT DETECTED   Escherichia coli DETECTED (A) NOT DETECTED   Klebsiella oxytoca NOT DETECTED NOT DETECTED   Klebsiella pneumoniae NOT DETECTED NOT DETECTED   Proteus species NOT DETECTED NOT DETECTED   Serratia marcescens NOT DETECTED NOT DETECTED   Carbapenem resistance NOT DETECTED NOT DETECTED   Haemophilus influenzae NOT DETECTED NOT DETECTED   Neisseria meningitidis NOT DETECTED NOT DETECTED   Pseudomonas aeruginosa NOT DETECTED NOT DETECTED   Candida albicans NOT DETECTED NOT DETECTED   Candida glabrata NOT DETECTED NOT DETECTED   Candida krusei NOT DETECTED NOT DETECTED   Candida parapsilosis NOT DETECTED NOT DETECTED   Candida tropicalis NOT DETECTED NOT DETECTED    Name of physician (or Provider) Contacted: IM team  Changes to prescribed antibiotics required: none, continue with Cefepime 1 gram IV every 24 hours which is appropriate for current renal function.    Pollyann SamplesAndy Zenia Guest, PharmD, BCPS 07/01/2016, 6:29 AM Pager: [phone]

## 2016-07-02 LAB — HEPATITIS PANEL, ACUTE
HCV Ab: <0.1 s/co ratio (ref 0.0–0.9)
HEP B S AG: NEGATIVE
Hep A IgM: NEGATIVE
Hep B C IgM: NEGATIVE

## 2016-07-02 LAB — BASIC METABOLIC PANEL
Anion gap: 10 (ref 5–15)
BUN: 42 mg/dL — ABNORMAL HIGH (ref 6–20)
CHLORIDE: 114 mmol/L — AB (ref 101–111)
CO2: 14 mmol/L — ABNORMAL LOW (ref 22–32)
CREATININE: 3.9 mg/dL — AB (ref 0.44–1.00)
Calcium: 7.2 mg/dL — ABNORMAL LOW (ref 8.9–10.3)
GFR, EST AFRICAN AMERICAN: 16 mL/min — AB (ref >60)
GFR, EST NON AFRICAN AMERICAN: 14 mL/min — AB (ref >60)
Glucose, Bld: 116 mg/dL — ABNORMAL HIGH (ref 65–99)
POTASSIUM: 3.9 mmol/L (ref 3.5–5.1)
SODIUM: 138 mmol/L (ref 135–145)

## 2016-07-02 LAB — CBC
HCT: 27.6 % — ABNORMAL LOW (ref 36.0–46.0)
HEMATOCRIT: 32.3 % — AB (ref 36.0–46.0)
Hemoglobin: 9.5 g/dL — ABNORMAL LOW (ref 12.0–15.0)
Hemoglobin: 11.2 g/dL — ABNORMAL LOW (ref 12.0–15.0)
MCH: 27.1 pg (ref 26.0–34.0)
MCH: 27.7 pg (ref 26.0–34.0)
MCHC: 34.4 g/dL (ref 30.0–36.0)
MCHC: 34.7 g/dL (ref 30.0–36.0)
MCV: 78.9 fL (ref 78.0–100.0)
MCV: 79.8 fL (ref 78.0–100.0)
PLATELETS: 79 10*3/uL — AB (ref 150–400)
Platelets: 96 10*3/uL — ABNORMAL LOW (ref 150–400)
RBC: 3.5 MIL/uL — ABNORMAL LOW (ref 3.87–5.11)
RBC: 4.05 MIL/uL (ref 3.87–5.11)
RDW: 13.8 % (ref 11.5–15.5)
RDW: 14 % (ref 11.5–15.5)
WBC: 5.2 10*3/uL (ref 4.0–10.5)
WBC: 7.7 10*3/uL (ref 4.0–10.5)

## 2016-07-02 LAB — COMPREHENSIVE METABOLIC PANEL
ALBUMIN: 1.8 g/dL — AB (ref 3.5–5.0)
ALT: 24 U/L (ref 14–54)
AST: 21 U/L (ref 15–41)
Alkaline Phosphatase: 61 U/L (ref 38–126)
Anion gap: 7 (ref 5–15)
BILIRUBIN TOTAL: 1.9 mg/dL — AB (ref 0.3–1.2)
BUN: 42 mg/dL — AB (ref 6–20)
CO2: 15 mmol/L — ABNORMAL LOW (ref 22–32)
CREATININE: 4.05 mg/dL — AB (ref 0.44–1.00)
Calcium: 6.8 mg/dL — ABNORMAL LOW (ref 8.9–10.3)
Chloride: 115 mmol/L — ABNORMAL HIGH (ref 101–111)
GFR calc Af Amer: 15 mL/min — ABNORMAL LOW (ref >60)
GFR, EST NON AFRICAN AMERICAN: 13 mL/min — AB (ref >60)
GLUCOSE: 136 mg/dL — AB (ref 65–99)
Potassium: 3.6 mmol/L (ref 3.5–5.1)
Sodium: 137 mmol/L (ref 135–145)
TOTAL PROTEIN: 4.9 g/dL — AB (ref 6.5–8.1)

## 2016-07-02 LAB — URINE CULTURE: Culture: 20000 — AB

## 2016-07-02 LAB — GLUCOSE, CAPILLARY
GLUCOSE-CAPILLARY: 115 mg/dL — AB (ref 65–99)
GLUCOSE-CAPILLARY: 152 mg/dL — AB (ref 65–99)
Glucose-Capillary: 100 mg/dL — ABNORMAL HIGH (ref 65–99)
Glucose-Capillary: 121 mg/dL — ABNORMAL HIGH (ref 65–99)

## 2016-07-02 LAB — HEMOGLOBIN A1C
HEMOGLOBIN A1C: 5.8 % — AB (ref 4.8–5.6)
Mean Plasma Glucose: 120 mg/dL

## 2016-07-02 LAB — SAVE SMEAR

## 2016-07-02 LAB — LACTATE DEHYDROGENASE: LDH: 291 U/L — AB (ref 98–192)

## 2016-07-02 MED ORDER — SODIUM CHLORIDE 0.9 % IV BOLUS (SEPSIS)
1000.0000 mL | Freq: Once | INTRAVENOUS | Status: AC
Start: 2016-07-02 — End: 2016-07-02
  Administered 2016-07-02: 1000 mL via INTRAVENOUS

## 2016-07-02 MED ORDER — ACETAMINOPHEN 500 MG PO TABS
1000.0000 mg | ORAL_TABLET | Freq: Once | ORAL | Status: AC
  Administered 2016-07-03: 1000 mg via ORAL
  Filled 2016-07-02: qty 2

## 2016-07-02 MED ORDER — CEFAZOLIN IN D5W 1 GM/50ML IV SOLN
1.0000 g | Freq: Two times a day (BID) | INTRAVENOUS | Status: DC
  Administered 2016-07-02 – 2016-07-05 (×8): 1 g via INTRAVENOUS
  Filled 2016-07-02 (×10): qty 50

## 2016-07-02 MED ORDER — DEXTROSE-NACL 5-0.45 % IV SOLN
INTRAVENOUS | Status: DC
  Administered 2016-07-02 – 2016-07-03 (×2): via INTRAVENOUS
  Administered 2016-07-03: 1000 mL via INTRAVENOUS

## 2016-07-02 NOTE — Care Management Note (Addendum)
Case Management Note  Patient Details  Name: Wyona Almasang Lafrance MRN: [id number] Date of Birth: 06/20/1981  Subjective/Objective:   Presents with pyelo, patient speaks Burmese,  NCM spoke with MaliJasmine the Artistfinancial counselor , she states patient has family planning medicaid which is not the regular medicaid.  [facility]  Jasmine states they will try to locate patient a burmese interpreter to get medicaid going. She may need Match letter ast at dc for medication ast .   NCM will cont to follow for dc needs.                  Action/Plan:   Expected Discharge Date:                  Expected Discharge Plan:  Home/Self Care  In-House Referral:     Discharge planning Services  CM Consult  Post Acute Care Choice:    Choice offered to:     DME Arranged:    DME Agency:     HH Arranged:    HH Agency:     Status of Service:  In process, will continue to follow  If discussed at Long Length of Stay Meetings, dates discussed:    Additional Comments:  Leone Havenaylor, Renette Hsu Clinton, RN 07/02/2016, 4:06 PM

## 2016-07-02 NOTE — Progress Notes (Signed)
Subjective: Currently, the patient is feeling improved, though still feverish. She complains of generealized body aches, but no focal pain. She reports not issues with urination and bedside US does not show evidence of hydronephrosis.  Interval Events: BP remain stable but low. Required 1L NS bolus this AM. Continues to remain afebrile with stable WBC. Speciation shows sensitive E. Coli.  Objective: Vital signs in last 24 hours: Vitals:   07/01/16 1612 07/01/16 2051 07/02/16 0002 07/02/16 0425  BP: (!) 102/59 99/71 106/75 90/63  Pulse: (!) 128  (!) 111 99  Resp: (!) 27   (!) 28  Temp:  98.4 F (36.9 C) 100 F (37.8 C) 98.1 F (36.7 C)  TempSrc:  Axillary Oral Oral  SpO2: 94% 95% 93%   Weight:      Height:       24-hour weight change: Filed Weights   06/30/16 1044 06/30/16 2138  Weight: 54.4 kg (120 lb) 66.2 kg (145 lb 15.1 oz)   Intake/Output:  11/16 0701 - 11/17 0700 In: 2943 [P.O.:240; I.V.:2503; IV Piggyback:200] Out: 1000 [Urine:1000]    Physical Exam: Physical Exam  Constitutional: She appears well-developed. She is cooperative. She appears ill. No distress.  Cardiovascular: Normal rate, regular rhythm, normal heart sounds, intact distal pulses and normal pulses.  Exam reveals no gallop.   No murmur heard. Pulmonary/Chest: Effort normal and breath sounds normal. No respiratory distress. Breasts are symmetrical.  Abdominal: Soft. Bowel sounds are normal. She exhibits no distension. There is no tenderness.  Musculoskeletal: She exhibits tenderness (diffuse). She exhibits no edema.  Neurological: She is alert.  Skin: Skin is warm.   Labs: CBC:  Recent Labs Lab 06/30/16 1059 07/01/16 0252 07/02/16 0229  WBC 11.3* 6.8 5.2  HGB 11.9* 10.9* 9.5*  HCT 34.1* 31.4* 27.6*  MCV 81.0 80.5 78.9  PLT 109* 73* 79*   Metabolic Panel:  Recent Labs Lab 06/30/16 1059 07/01/16 0252 07/02/16 0229  NA 133* 136 137  K 3.0* 3.9 3.6  CL 98* 110 115*  CO2 20* 17* 15*    GLUCOSE 208* 160* 136*  BUN 36* 41* 42*  CREATININE 3.33* 3.89* 4.05*  CALCIUM 8.2* 6.6* 6.8*  MG  --  1.7  --   ALT 48 29 24  ALKPHOS 87 68 61  BILITOT 2.2* 2.3* 1.9*  PROT 7.3 6.2* 4.9*  ALBUMIN 3.0* 2.0* 1.8*  LIPASE 21  --   --    BG:  Recent Labs Lab 06/30/16 2223 07/01/16 0824 07/01/16 1202 07/01/16 1610 07/01/16 2149  GLUCAP 131* 100* 165* 97 150*   Lab Results  Component Value Date   HGBA1C 5.8 (H) 06/30/2016   Microbiology: BCx 2/2 + E. Coli UCx + E. Coli (non-ESBL)   Medications: Infusions: . sodium chloride 125 mL/hr at 07/02/16 0636   Scheduled Medications: . Influenza vac split quadrivalent PF  0.5 mL Intramuscular Tomorrow-1000  . insulin aspart  0-9 Units Subcutaneous TID WC  . meropenem (MERREM) IV  1 g Intravenous Q12H  . sodium chloride flush  3 mL Intravenous Q12H   PRN Medications: acetaminophen **OR** acetaminophen, ondansetron **OR** ondansetron (ZOFRAN) IV  Assessment/Plan: Pt is a 35 y.o. yo female with an uncomplicated PMHx who was admitted on 06/30/2016 with symptoms of fever, chills, N/V, and right flank pain, which was determined to be secondary to pyelonephritis and urosepsis.  Presented w/ significant hypotension and septic shock and resulting AKI. She is now on appropriate abx therapy x 3 days and has had stable  low-normal BPs with occasional breakthrough fevers.  Pyelonephritis, Urosepsis, E. coli Bacteremia: BP remained stable ON with continued tachycardia and tachypnea. Urine culture with 20k colonies/ml E. Coli. Blood cultures x 2 with E. Coli. WBC stable at 5.2. Hyperchloremic non-AG metabolic acidosis consistent w/ sepsis and large volume crystalloid resuscitation. - Change to cefazolin 1g q12h following sensitivities - Continue NS@125ml /hr. Continue to give NS boluses for hypotension (goal MAP >65) - monitor acidosis with BID BMP  AKI: Cr 3.33 on admission, now 4.05. Likely pre-renal, though concerned for ATN. UOP 2.2L  recorded. - Strict in/out - Continue to trend creatinine BID, consider nephrology involvement if SCr continues to risk - Continue IV fluids - check urine microscopy  Thrombocytopenia: Plt stable at 79 this morning (2015 baseline 211). Likely caused by bacterial infection/sepsis. R/o hemolysis in light of hyperbili. - Continue SCD, hold pharm VTE ppx. - Check LDH and peripheral blood smear  Chronic normocytic anemia: Hgb 10.9-->9.5. Baseline is around 10.5. Suspect dilution w/ IVF. Continue to monitor w/ qD CBC.  Hyperbilirubinemia: TBili trending down to 4.9 from 6.8. Likely related to sepsis. May have underlying Gilbert disease. -f/u hepatitis panel  Hyperglycemia: BG stable w/o insulin requirement. Currently not taking PO. A1c 5.8. Discontinue SSI.  Length of Stay: 2 day(s) Dispo: Anticipated discharge in approximately 2-3 day(s).  Carolynn CommentBryan Thos Matsumoto, MD Pager: [phone] (7AM-5PM) 07/02/2016, 7:18 AM

## 2016-07-02 NOTE — Plan of Care (Signed)
Problem: Urinary Elimination: Goal: Signs and symptoms of infection will decrease Outcome: Progressing Patient urine culture came back positive. Patient still shivering extensively  and complaining of being cold, with multiple blankets. Requesting tylenol by name.

## 2016-07-02 NOTE — Progress Notes (Signed)
Dr. Earlene PlaterWallace and myself went to evaluate the patient at approximately 11pm at the request of the day team. Recently was paged by RN that her HR was in 140's which is unusual for the patient. RN reports that pt has had no complaints and was given tylenol several hours earlier. Telephone translator was utilized to speak with the patient. She reports she is feeling very hot and sweaty and that she is hungry. Denies any other complaints including chest pain, SOB, abdominal pain, lightheadedness or cough. Inquired when she would be able to go home. Was explained to the patient that she will still require several days of IV antibiotics.   General: Alert, pleasant woman resting in bed. Visibly anxious and uncomfortable. Pt indicating she would like all the blankets removed.  Pulmonary: CTA BL without crackles or wheezing Cardiovascular: Tachycardic, rate 140. No murmur appreciated Abdomen: Soft, non-tender and not distended Extremities: no peripheral edema noted Skin: Warm, clammy. Diaphoretic. Flushed.  Assessment & Plan: This is a 35 y/o F who presented 11/15 and was diagnosed with acute pyelonephritis. Was started on broad spec [facility] continued to remain hypotensive despite IV abx and several liters of fluid. Urine and blood cultures returned growing pan-sensitive E.coli and treatment was narrowed to Ancef earlier today. The patient continues to be febrile, temp currently 103* during evaluation. BP has improved significantly since admission, currently 110/80. She appears to be keeping MAP >65. HR during exam was 140 and denied any CP or SOB. Tachycardia likely secondary to current febrile illness and will try Tylenol 1000 mg now to try to resolve this, for a total of 4,000 mg today. Also applied ice packs and ordered cooling blanket. Will continue to monitor vitals throughout the night. May require additional pyretics with ASA. Patient may require additional imaging to r/o  perinephric abscess as cause of persistent fevers associated with pyelonephritis. RN informed to notify primary team of any changes.

## 2016-07-03 LAB — CBC
HCT: 28.2 % — ABNORMAL LOW (ref 36.0–46.0)
Hemoglobin: 9.8 g/dL — ABNORMAL LOW (ref 12.0–15.0)
MCH: 27.2 pg (ref 26.0–34.0)
MCHC: 34.8 g/dL (ref 30.0–36.0)
MCV: 78.3 fL (ref 78.0–100.0)
PLATELETS: 93 10*3/uL — AB (ref 150–400)
RBC: 3.6 MIL/uL — ABNORMAL LOW (ref 3.87–5.11)
RDW: 14.1 % (ref 11.5–15.5)
WBC: 6.1 10*3/uL (ref 4.0–10.5)

## 2016-07-03 LAB — BASIC METABOLIC PANEL
Anion gap: 8 (ref 5–15)
BUN: 40 mg/dL — AB (ref 6–20)
CHLORIDE: 113 mmol/L — AB (ref 101–111)
CO2: 15 mmol/L — AB (ref 22–32)
CREATININE: 3.47 mg/dL — AB (ref 0.44–1.00)
Calcium: 7 mg/dL — ABNORMAL LOW (ref 8.9–10.3)
GFR calc Af Amer: 18 mL/min — ABNORMAL LOW (ref >60)
GFR calc non Af Amer: 16 mL/min — ABNORMAL LOW (ref >60)
Glucose, Bld: 147 mg/dL — ABNORMAL HIGH (ref 65–99)
Potassium: 3.3 mmol/L — ABNORMAL LOW (ref 3.5–5.1)
SODIUM: 136 mmol/L (ref 135–145)

## 2016-07-03 LAB — GLUCOSE, CAPILLARY
Glucose-Capillary: 177 mg/dL — ABNORMAL HIGH (ref 65–99)
Glucose-Capillary: 152 mg/dL — ABNORMAL HIGH (ref 65–99)
Glucose-Capillary: 113 mg/dL — ABNORMAL HIGH (ref 65–99)
Glucose-Capillary: 132 mg/dL — ABNORMAL HIGH (ref 65–99)

## 2016-07-03 LAB — CULTURE, BLOOD (ROUTINE X 2)

## 2016-07-03 MED ORDER — POTASSIUM CHLORIDE 2 MEQ/ML IV SOLN
INTRAVENOUS | Status: AC
  Administered 2016-07-03 (×2): via INTRAVENOUS
  Filled 2016-07-03 (×4): qty 1000

## 2016-07-03 NOTE — Plan of Care (Signed)
Problem: Urinary Elimination: Goal: Signs and symptoms of infection will decrease Outcome: Progressing Patient spiked a fever of 103 on PM shift with HR in 140's. MD team notified and assessed patient. Patient given 1,000 mg dose of tylenol and ice packs placed. Patient fever resolved. Orders given for cooling blanket, however, patient did not require. Continuing to monitor patient for fevers. Patient encouraged to take in more food and fluids. Spoke with patient's father about importance of bringing /ordering food she will eat, as we don't keep a lot of food on the floor. Patient consistently requesting apple juice.

## 2016-07-03 NOTE — Progress Notes (Signed)
Subjective: [name] feels fair this morning after some fevers up to 103 last night with associated tachycardia. She denies any dysuria, trouble emptying her bladder fully, or abdominal pain. She is eating a portion of meals and drinking well.  Objective: Vital signs in last 24 hours: Vitals:   07/03/16 0600 07/03/16 0700 07/03/16 0800 07/03/16 0937  BP: 106/76 106/68 110/78   Pulse: (!) 111 (!) 121 (!) 111   Resp: (!) 29 (!) 37 14   Temp:    99.2 F (37.3 C)  TempSrc:    Oral  SpO2: 96% 92% 92%   Weight:      Height:       24-hour weight change: Filed Weights   06/30/16 1044 06/30/16 2138  Weight: 120 lb (54.4 kg) 145 lb 15.1 oz (66.2 kg)   Intake/Output:  11/17 0701 - 11/18 0700 In: 2665.5 [I.V.:2565.5; IV Piggyback:100] Out: 2425 [Urine:2425]    Physical Exam: Physical Exam  Constitutional: She appears well-developed. She is cooperative. She appears ill. No distress.  Cardiovascular: Regular rhythm, normal heart sounds, intact distal pulses and normal pulses.   Tachycardic  Pulmonary/Chest: Effort normal. No respiratory distress. Breasts are symmetrical.  Faint inspiratory crackles on R lower lung field, with good air movement  Abdominal: Soft. Bowel sounds are normal. She exhibits no distension. There is no tenderness.  Musculoskeletal: Tenderness: diffuse.  Mild upper and lower extremity edema noted bilaterally  Neurological: She is alert.  Skin: Skin is warm.   Labs: CBC:  Recent Labs Lab 06/30/16 1059 07/01/16 0252 07/02/16 0229 07/02/16 1423 07/03/16 0204  WBC 11.3* 6.8 5.2 7.7 6.1  HGB 11.9* 10.9* 9.5* 11.2* 9.8*  HCT 34.1* 31.4* 27.6* 32.3* 28.2*  MCV 81.0 80.5 78.9 79.8 78.3  PLT 109* 73* 79* 96* 93*   Metabolic Panel:  Recent Labs Lab 06/30/16 1059 07/01/16 0252 07/02/16 0229 07/02/16 1423 07/03/16 0204  NA 133* 136 137 138 136  K 3.0* 3.9 3.6 3.9 3.3*  CL 98* 110 115* 114* 113*  CO2 20* 17* 15* 14* 15*  GLUCOSE 208* 160* 136* 116*  147*  BUN 36* 41* 42* 42* 40*  CREATININE 3.33* 3.89* 4.05* 3.90* 3.47*  CALCIUM 8.2* 6.6* 6.8* 7.2* 7.0*  MG  --  1.7  --   --   --   ALT 48 29 24  --   --   ALKPHOS 87 68 61  --   --   BILITOT 2.2* 2.3* 1.9*  --   --   PROT 7.3 6.2* 4.9*  --   --   ALBUMIN 3.0* 2.0* 1.8*  --   --   LIPASE 21  --   --   --   --    BG:  Recent Labs Lab 07/02/16 0820 07/02/16 1206 07/02/16 1618 07/02/16 2137 07/03/16 0833  GLUCAP 100* 115* 152* 121* 177*     Lab Results  Component Value Date   HGBA1C 5.8 (H) 06/30/2016     Microbiology: BCx 11/15: 2/2 E. Coli UCx 11/15: E. Coli (non-ESBL)   Medications: Infusions: . dextrose 5 % and 0.45% NaCl 1,000 mL with potassium chloride 20 mEq infusion     Scheduled Medications: .  ceFAZolin (ANCEF) IV  1 g Intravenous Q12H  . sodium chloride flush  3 mL Intravenous Q12H   PRN Medications: acetaminophen **OR** acetaminophen, ondansetron **OR** ondansetron (ZOFRAN) IV  Assessment/Plan: Pt is a 35 y.o. yo female with an uncomplicated PMHx who was admitted on 06/30/2016 with symptoms  of fever, chills, N/V, and right flank pain, secondary to pyelonephritis and gram negative bacteremia.  She presented w/ significant volume depletion, sepsis, and AKI. She has now been treated with parenteral antibiotics for about 72 hours. Her blood pressure, tachycardia, and renal function are improving although she continues to experience intermittent fevers up to 103F throughout this admission.  Pyelonephritis, E. coli Bacteremia: Her blood pressure is improved today although fever and tachycardia continue. Her clinical response is now relatively delayed. We will test repeat blood cultures today to ensure clearance. With known susceptibilities we would have to consider a possible source such as perinephric abscess although initial imaging studies did not indicate this so far. - Continue cefazolin 1g q12h (abtx day 4) - Decrease fluid to 16800mL/hr with improved  volume status - Low threshol to repeat CT imaging if cultures remain positive or she redevelops fever again today  AKI: SCr trending upward to 4.05 until yesterday PM now improving to 3.47. Her metabolic acidosis with respiratory compensation is also starting to improve. I will decrease IV fluids slightly due to mild peripheral edmema, improved blood pressure, and very faint basilar inspiratory crackles. -continuing IV fluids 18600mL/hr -Supplementing potassium -Will decrease serum chemistry testing to qAM with evidence of improvement  Thrombocytopenia: Slight improvement in platelet count on CBC this morning. My personal review of her peripheral blood smear did not find any schistocytes or grossly abnormal RBC morphology. There were a few platelet clumps and I counted about 6-7 platelets per high power field. - Continue SCD for thrombocytopenia  Chronic normocytic anemia: Some variations in reported values 9.5-11. Anemia appears mild and not decreasing significantly. LDH elevated to 290s. Findings are suggestive of a low grade hemolysis.  Hyperbilirubinemia: Acute hepatitis panel is negative.  Length of Stay: 3 day(s)  Dispo: Anticipated discharge in approximately 2-4 day(s).   Sharon Planhristopher W Makaiah Terwilliger, MD PGY-II Internal Medicine Resident Pager# [phone] 07/03/2016, 11:16 AM

## 2016-07-04 LAB — URINALYSIS, ROUTINE W REFLEX MICROSCOPIC
Bilirubin Urine: NEGATIVE
Glucose, UA: NEGATIVE mg/dL
Ketones, ur: NEGATIVE mg/dL
NITRITE: NEGATIVE
PROTEIN: 30 mg/dL — AB
Specific Gravity, Urine: 1.011 (ref 1.005–1.030)
pH: 6.5 (ref 5.0–8.0)

## 2016-07-04 LAB — CBC
HEMATOCRIT: 29.1 % — AB (ref 36.0–46.0)
HEMOGLOBIN: 10.1 g/dL — AB (ref 12.0–15.0)
MCH: 26.9 pg (ref 26.0–34.0)
MCHC: 34.7 g/dL (ref 30.0–36.0)
MCV: 77.6 fL — ABNORMAL LOW (ref 78.0–100.0)
Platelets: 111 10*3/uL — ABNORMAL LOW (ref 150–400)
RBC: 3.75 MIL/uL — AB (ref 3.87–5.11)
RDW: 14.5 % (ref 11.5–15.5)
WBC: 7.6 10*3/uL (ref 4.0–10.5)

## 2016-07-04 LAB — URINE MICROSCOPIC-ADD ON

## 2016-07-04 LAB — COMPREHENSIVE METABOLIC PANEL
ALK PHOS: 112 U/L (ref 38–126)
ALT: 43 U/L (ref 14–54)
ANION GAP: 8 (ref 5–15)
AST: 114 U/L — ABNORMAL HIGH (ref 15–41)
Albumin: 1.6 g/dL — ABNORMAL LOW (ref 3.5–5.0)
BILIRUBIN TOTAL: 1.4 mg/dL — AB (ref 0.3–1.2)
BUN: 32 mg/dL — ABNORMAL HIGH (ref 6–20)
CALCIUM: 7.2 mg/dL — AB (ref 8.9–10.3)
CO2: 15 mmol/L — ABNORMAL LOW (ref 22–32)
CREATININE: 2.95 mg/dL — AB (ref 0.44–1.00)
Chloride: 111 mmol/L (ref 101–111)
GFR calc non Af Amer: 19 mL/min — ABNORMAL LOW (ref >60)
GFR, EST AFRICAN AMERICAN: 22 mL/min — AB (ref >60)
GLUCOSE: 162 mg/dL — AB (ref 65–99)
Potassium: 3.5 mmol/L (ref 3.5–5.1)
Sodium: 134 mmol/L — ABNORMAL LOW (ref 135–145)
TOTAL PROTEIN: 4.9 g/dL — AB (ref 6.5–8.1)

## 2016-07-04 LAB — GLUCOSE, CAPILLARY: Glucose-Capillary: 121 mg/dL — ABNORMAL HIGH (ref 65–99)

## 2016-07-04 MED ORDER — KCL IN DEXTROSE-NACL 20-5-0.45 MEQ/L-%-% IV SOLN
INTRAVENOUS | Status: AC
  Administered 2016-07-04 (×2): via INTRAVENOUS
  Filled 2016-07-04 (×3): qty 1000

## 2016-07-04 MED ORDER — POTASSIUM CHLORIDE 2 MEQ/ML IV SOLN: INTRAVENOUS | Status: DC

## 2016-07-04 MED ORDER — HEPARIN SODIUM (PORCINE) 5000 UNIT/ML IJ SOLN
5000.0000 [IU] | Freq: Three times a day (TID) | INTRAMUSCULAR | Status: DC
  Administered 2016-07-04 – 2016-07-05 (×2): 5000 [IU] via SUBCUTANEOUS
  Filled 2016-07-04 (×2): qty 1

## 2016-07-04 NOTE — Progress Notes (Signed)
Report called to [facility] spoke to Dollar GeneralKelly RN

## 2016-07-04 NOTE — Progress Notes (Signed)
Pt transferred to [facility] per W/C accompanied by 2 Nurse techs with all personal belongings

## 2016-07-04 NOTE — Progress Notes (Signed)
Subjective: Sharon Mcconnell is feeling comfortable, sitting up in bed. She reports that she was able to eat some fruits yesterday and that she has a small appetite. She has no focal complaints.  Objective: Vital signs in last 24 hours: Vitals:   07/03/16 0600 07/03/16 0700 07/03/16 0800 07/03/16 0937  BP: 106/76 106/68 110/78   Pulse: (!) 111 (!) 121 (!) 111   Resp: (!) 29 (!) 37 14   Temp:    99.2 F (37.3 C)  TempSrc:    Oral  SpO2: 96% 92% 92%   Weight:      Height:       24-hour weight change: Filed Weights   06/30/16 1044 06/30/16 2138  Weight: 120 lb (54.4 kg) 145 lb 15.1 oz (66.2 kg)   Intake/Output:  11/17 0701 - 11/18 0700 In: 2665.5 [I.V.:2565.5; IV Piggyback:100] Out: 2425 [Urine:2425]    Physical Exam: Physical Exam  Constitutional: She appears well-developed. She is cooperative. No distress.  Cardiovascular: Normal rate, regular rhythm, normal heart sounds, intact distal pulses and normal pulses.   Pulmonary/Chest: Effort normal. No respiratory distress. Breasts are symmetrical.  Good air movement w/o evidence of crackles  Abdominal: Soft. Bowel sounds are normal. She exhibits no distension. There is no tenderness.  Musculoskeletal: She exhibits edema (trace edema of bil LE to the ankle).  Neurological: She is alert.  Skin: Skin is warm and dry. She is not diaphoretic.   Labs: CBC:  Recent Labs Lab 06/30/16 1059 07/01/16 0252 07/02/16 0229 07/02/16 1423 07/03/16 0204  WBC 11.3* 6.8 5.2 7.7 6.1  HGB 11.9* 10.9* 9.5* 11.2* 9.8*  HCT 34.1* 31.4* 27.6* 32.3* 28.2*  MCV 81.0 80.5 78.9 79.8 78.3  PLT 109* 73* 79* 96* 93*   Metabolic Panel:  Recent Labs Lab 06/30/16 1059 07/01/16 0252 07/02/16 0229 07/02/16 1423 07/03/16 0204  NA 133* 136 137 138 136  K 3.0* 3.9 3.6 3.9 3.3*  CL 98* 110 115* 114* 113*  CO2 20* 17* 15* 14* 15*  GLUCOSE 208* 160* 136* 116* 147*  BUN 36* 41* 42* 42* 40*  CREATININE 3.33* 3.89* 4.05* 3.90* 3.47*  CALCIUM 8.2* 6.6*  6.8* 7.2* 7.0*  MG  --  1.7  --   --   --   ALT 48 29 24  --   --   ALKPHOS 87 68 61  --   --   BILITOT 2.2* 2.3* 1.9*  --   --   PROT 7.3 6.2* 4.9*  --   --   ALBUMIN 3.0* 2.0* 1.8*  --   --   LIPASE 21  --   --   --   --    BG:  Recent Labs Lab 07/02/16 0820 07/02/16 1206 07/02/16 1618 07/02/16 2137 07/03/16 0833  GLUCAP 100* 115* 152* 121* 177*     Lab Results  Component Value Date   HGBA1C 5.8 (H) 06/30/2016     Microbiology: BCx 11/15: 2/2 E. Coli UCx 11/15: E. Coli (non-ESBL)  BCx 11/18: pending   Medications: Infusions: . dextrose 5 % and 0.45% NaCl 1,000 mL with potassium chloride 20 mEq infusion     Scheduled Medications: .  ceFAZolin (ANCEF) IV  1 g Intravenous Q12H  . sodium chloride flush  3 mL Intravenous Q12H   PRN Medications: acetaminophen **OR** acetaminophen, ondansetron **OR** ondansetron (ZOFRAN) IV  Assessment/Plan: Pt is a 35 y.o. yo female with an uncomplicated PMHx who was admitted on 06/30/2016 with symptoms of fever, chills, N/V, and  right flank pain, secondary to pyelonephritis and gram negative bacteremia.  She presented w/ significant volume depletion, sepsis, and AKI. She has now been treated with parenteral antibiotics for about 84 hours. Her blood pressure, tachycardia, and renal function are improving although she continues to experience intermittent fevers up to 103F throughout this admission.  Pyelonephritis, E. coli Bacteremia: Her blood pressure remains stable although fever and tachycardia continue intermittently. Low threshold for repeat CT to eval for perinephric abcess given known susceptability to current Abx. - f/u repeat BCx - Continue cefazolin 1g q12h (abtx day 5) - Monitor volume status w/ IVF  AKI: SCr peak at 4.05, now 2.95. UOP adequate at 1.7 mL/kg/hr yesterday. Her mild metabolic acidosis persists. - continuing IV fluids 19400mL/hr - Replete K  Thrombocytopenia: continues to improve, likely 2/2 sepsis. -  Continue SCD for thrombocytopenia  Chronic normocytic anemia: Possible low grade hemolysis with mild elevation in LDH, peripheral smear reasurring. Hgb remains stable, continue to monitor.  Hyperbilirubinemia: TBili normalized, Acute hepatitis panel is negative.  Length of Stay: 3 day(s)  Dispo: Anticipated discharge in approximately 2-4 day(s).   Fuller Planhristopher W Rice, MD PGY-II Internal Medicine Resident Pager# [phone] 07/03/2016, 11:16 AM

## 2016-07-05 LAB — COMPREHENSIVE METABOLIC PANEL
ALK PHOS: 189 U/L — AB (ref 38–126)
ALT: 39 U/L (ref 14–54)
AST: 87 U/L — AB (ref 15–41)
Albumin: 1.6 g/dL — ABNORMAL LOW (ref 3.5–5.0)
Anion gap: 9 (ref 5–15)
BILIRUBIN TOTAL: 1.1 mg/dL (ref 0.3–1.2)
BUN: 28 mg/dL — AB (ref 6–20)
CALCIUM: 7.4 mg/dL — AB (ref 8.9–10.3)
CO2: 18 mmol/L — ABNORMAL LOW (ref 22–32)
CREATININE: 2.27 mg/dL — AB (ref 0.44–1.00)
Chloride: 110 mmol/L (ref 101–111)
GFR calc Af Amer: 31 mL/min — ABNORMAL LOW (ref >60)
GFR, EST NON AFRICAN AMERICAN: 27 mL/min — AB (ref >60)
Glucose, Bld: 129 mg/dL — ABNORMAL HIGH (ref 65–99)
Potassium: 3.8 mmol/L (ref 3.5–5.1)
Sodium: 137 mmol/L (ref 135–145)
TOTAL PROTEIN: 5.1 g/dL — AB (ref 6.5–8.1)

## 2016-07-05 LAB — CBC
HCT: 27.9 % — ABNORMAL LOW (ref 36.0–46.0)
Hemoglobin: 9.7 g/dL — ABNORMAL LOW (ref 12.0–15.0)
MCH: 26.9 pg (ref 26.0–34.0)
MCHC: 34.8 g/dL (ref 30.0–36.0)
MCV: 77.3 fL — AB (ref 78.0–100.0)
PLATELETS: 161 10*3/uL (ref 150–400)
RBC: 3.61 MIL/uL — ABNORMAL LOW (ref 3.87–5.11)
RDW: 14.6 % (ref 11.5–15.5)
WBC: 10.2 10*3/uL (ref 4.0–10.5)

## 2016-07-05 MED ORDER — SODIUM CHLORIDE 0.9% FLUSH: 10.0000 mL | INTRAVENOUS | Status: DC | PRN

## 2016-07-05 MED ORDER — ENOXAPARIN SODIUM 40 MG/0.4ML ~~LOC~~ SOLN
40.0000 mg | SUBCUTANEOUS | Status: DC
  Administered 2016-07-05: 40 mg via SUBCUTANEOUS
  Filled 2016-07-05: qty 0.4

## 2016-07-05 NOTE — Progress Notes (Signed)
Subjective: Sharon Mcconnell is feeling comfortable, sitting up in bed. She reports that she was able to eat some. She has not complaints and is excited about the possibility of discharge in the next 1-2 days.  Objective: Vital signs in last 24 hours: Vitals:   07/03/16 0600 07/03/16 0700 07/03/16 0800 07/03/16 0937  BP: 106/76 106/68 110/78   Pulse: (!) 111 (!) 121 (!) 111   Resp: (!) 29 (!) 37 14   Temp:    99.2 F (37.3 C)  TempSrc:    Oral  SpO2: 96% 92% 92%   Weight:      Height:       24-hour weight change: Filed Weights   06/30/16 1044 06/30/16 2138  Weight: 120 lb (54.4 kg) 145 lb 15.1 oz (66.2 kg)   Intake/Output:  11/17 0701 - 11/18 0700 In: 2665.5 [I.V.:2565.5; IV Piggyback:100] Out: 2425 [Urine:2425]    Physical Exam: Physical Exam  Constitutional: She appears well-developed. She is cooperative. No distress.  Cardiovascular: Normal rate, regular rhythm, normal heart sounds, intact distal pulses and normal pulses.   Pulmonary/Chest: Effort normal. No respiratory distress. Breasts are symmetrical.  Abdominal: Soft. Bowel sounds are normal. She exhibits no distension. There is no tenderness.  Musculoskeletal: She exhibits no edema.  Neurological: She is alert.  Skin: Skin is warm and dry. She is not diaphoretic.   Labs: CBC:  Recent Labs Lab 06/30/16 1059 07/01/16 0252 07/02/16 0229 07/02/16 1423 07/03/16 0204  WBC 11.3* 6.8 5.2 7.7 6.1  HGB 11.9* 10.9* 9.5* 11.2* 9.8*  HCT 34.1* 31.4* 27.6* 32.3* 28.2*  MCV 81.0 80.5 78.9 79.8 78.3  PLT 109* 73* 79* 96* 93*   Metabolic Panel:  Recent Labs Lab 06/30/16 1059 07/01/16 0252 07/02/16 0229 07/02/16 1423 07/03/16 0204  NA 133* 136 137 138 136  K 3.0* 3.9 3.6 3.9 3.3*  CL 98* 110 115* 114* 113*  CO2 20* 17* 15* 14* 15*  GLUCOSE 208* 160* 136* 116* 147*  BUN 36* 41* 42* 42* 40*  CREATININE 3.33* 3.89* 4.05* 3.90* 3.47*  CALCIUM 8.2* 6.6* 6.8* 7.2* 7.0*  MG  --  1.7  --   --   --   ALT 48 29 24  --    --   ALKPHOS 87 68 61  --   --   BILITOT 2.2* 2.3* 1.9*  --   --   PROT 7.3 6.2* 4.9*  --   --   ALBUMIN 3.0* 2.0* 1.8*  --   --   LIPASE 21  --   --   --   --    BG:  Recent Labs Lab 07/02/16 0820 07/02/16 1206 07/02/16 1618 07/02/16 2137 07/03/16 0833  GLUCAP 100* 115* 152* 121* 177*     Lab Results  Component Value Date   HGBA1C 5.8 (H) 06/30/2016     Microbiology: BCx 11/15: 2/2 E. Coli UCx 11/15: E. Coli (non-ESBL)  BCx 11/18: NGx1D   Medications: Infusions: . dextrose 5 % and 0.45% NaCl 1,000 mL with potassium chloride 20 mEq infusion     Scheduled Medications: .  ceFAZolin (ANCEF) IV  1 g Intravenous Q12H  . sodium chloride flush  3 mL Intravenous Q12H   PRN Medications: acetaminophen **OR** acetaminophen, ondansetron **OR** ondansetron (ZOFRAN) IV  Assessment/Plan: Pt is a 35 y.o. yo female with an uncomplicated PMHx who was admitted on 06/30/2016 with symptoms of fever, chills, N/V, and right flank pain, secondary to pyelonephritis and gram negative bacteremia.  She  presented w/ significant volume depletion, sepsis, and AKI. Now treated with Parenteral Abx for 5 days with resolution of hypotention and tachycardia. Pt has been afebrile for 36 hours.  Pyelonephritis, E. coli Bacteremia: Pt is significantly improved clinically. Continue IV abx until 48h w/o fever. Discharge w/ 14 days PO abx for completion of therapy. - f/u repeat BCx for ToC - NGx1D - Continue cefazolin 1g q12h (abtx day 6) - Plan switch to Cephalexin PO tomorrow in preparation for DC  AKI: SCr peak at 4.05, now 2.27. UOP adequate at ~2L yesterday. Her mild metabolic acidosis resolving. - continuing IV fluids 15500mL/hr - Replete K PRN  Thrombocytopenia: Resolving. Begin lovenox VTE ppx.  Chronic normocytic anemia: Stable.  Hyperbilirubinemia: Resolved.  Length of Stay: 3 day(s)  Dispo: Anticipated discharge in approximately 1 day(s).   Carolynn CommentBryan Trinaty Bundrick, MD PGY-I Internal  Medicine Resident Pager# [phone] 07/03/2016, 11:16 AM

## 2016-07-05 NOTE — Discharge Summary (Signed)
Name: Sharon Mcconnell MRN: [id number] DOB: 1981-05-13 35 y.o. PCP: No primary care provider on file.  Date of Admission: 06/30/2016 11:00 AM Date of Discharge: 07/06/2016 Attending Physician: Axel Filler, MD  Discharge Diagnosis:   Pyelonephritis  Discharge Medications:   Medication List    STOP taking these medications   ACCU-CHEK FASTCLIX LANCETS Misc   acetaminophen 325 MG tablet Commonly known as:  TYLENOL   glucose blood test strip   ibuprofen 600 MG tablet Commonly known as:  ADVIL,MOTRIN     TAKE these medications   ACCU-CHEK AVIVA PLUS w/Device Kit 1 each by Does not apply route 4 (four) times daily.   cephALEXin 500 MG capsule Commonly known as:  KEFLEX Take 1 capsule (500 mg total) by mouth every 12 (twelve) hours.   prenatal multivitamin Tabs tablet Take 1 tablet by mouth daily.       Disposition and follow-up:   Ms.Sharon Mcconnell was discharged from [facility] in Good condition.  At the hospital follow up visit please address:  1.  Pyelonephritis: compliance with of abx therapy, resolution of AKI, counseling on UTI prevention and post-coital hygeine  2.  Labs / imaging needed at time of follow-up: SCr  Follow-up Appointments: Follow-up Information    [place]. Go on 07/07/2016.   Why:  Post hospital follow up scheduled for 07/07/2016 at 11:00 am with Zettie Pho PA Contact information: [place] [id number] [place] Hospital Course by problem list: Active Problems:   Pyelonephritis   1. Urosepsis, Pyelonephritis, E. coli Bacteremia: Patient presented at 4 days of UTI s/s including worsening nausea, vomitting, abdominal pain, and fever. She was significantly hypotensive and tachycardic in septic shock and received 5L of IVF resuscitation prior to hemodynamic stabilization. She was noted to have advanced pyelonephritis w/o hydronephrosis on  abdominal CT in the ED. She was started on broad spectrum parenteral abx and urine and blood cultures were drawn. Patient was found to have oliguric AKI with worsening SCr over the next 2 days from 3.33 to 4.05. With continued IVF her SCr then began to trend down with return of adequate urine output.  Urine and blood cultures grew non-ESBL E. coli and abx therapy was narrowed to IV cefazolin. Over the next 4 days the patients BP and HR stabilized and her fever curve normalized. Pt began to tolerate PO nutrition and fluids and she was transitioned to oral Keflex for a total of 14 day course, 48hrs after last fever. She was discharged to home with good family support and given instructions for follow-up with [facility].  Pt has presented with multiple UTIs this year and very severe kidney injury from urosepsis during this hospitalization. She was counseled on proper hygiene and UTI awareness in order to prevent future hospitalizations, as we are concerned that her kidney function may not tolerate repeated pyelonephritis and recommend continued counseling. We would also recommend considering post-coital antibiotic ppx if UTIs continue.  Discharge Vitals:   BP 132/83 (BP Location: Right Arm)   Pulse 72   Temp 99.5 F (37.5 C) (Oral)   Resp 18   Ht 5' 2"  (1.575 m)   Wt 66.2 kg (145 lb 15.1 oz)   LMP 06/29/2016   SpO2 99%   BMI 26.69 kg/m   Pertinent Labs, Studies, and Procedures: As above.  Procedures Performed:  Ct Abdomen Pelvis Wo Contrast - 06/30/16  IMPRESSION: 1. Large and heterogeneous right kidney without hydronephrosis, likely advanced pyelonephritis in this setting. Left kidney also appears large and may be affected to a lesser degree. 2. Cholelithiasis. 3. Normal appendix.  Discharge Instructions: Discharge Instructions    Call MD for:  difficulty breathing, headache or visual disturbances    Complete by:  As directed    Call MD for:  persistant nausea and  vomiting    Complete by:  As directed    Call MD for:  temperature >100.4    Complete by:  As directed    Diet - low sodium heart healthy    Complete by:  As directed    Discharge instructions    Complete by:  As directed    You were hospitalized with a serious infection of your kidney. These infections often result from less serious infections in your urine and bladder. It is very important to seek medical care immediately if your notice symptoms of urinary tract infection such as discomfort or burning with urination or changes in the color and quality of your urine. It is also very important to have appropriate hygeine to prevent urinary tract infection such as always wiping front to back after urination and voiding your bladder immediately after sexual activity.  You should continue to take your antibiotics by mouth for the next 12 days in order to ensure complete resolution of your infection. You should also remember to stay well-hydrated by drinking plenty of water.  Please follow-up in our clinic for evaluation after your hospitalization.   Increase activity slowly    Complete by:  As directed      Signed: Holley Raring, MD 07/06/2016, 10:05 AM   Pager: [phone]

## 2016-07-06 LAB — BASIC METABOLIC PANEL
ANION GAP: 7 (ref 5–15)
BUN: 22 mg/dL — ABNORMAL HIGH (ref 6–20)
CALCIUM: 7.6 mg/dL — AB (ref 8.9–10.3)
CHLORIDE: 113 mmol/L — AB (ref 101–111)
CO2: 20 mmol/L — AB (ref 22–32)
CREATININE: 1.74 mg/dL — AB (ref 0.44–1.00)
GFR calc non Af Amer: 37 mL/min — ABNORMAL LOW (ref >60)
GFR, EST AFRICAN AMERICAN: 42 mL/min — AB (ref >60)
Glucose, Bld: 126 mg/dL — ABNORMAL HIGH (ref 65–99)
Potassium: 3.5 mmol/L (ref 3.5–5.1)
SODIUM: 140 mmol/L (ref 135–145)

## 2016-07-06 MED ORDER — CEPHALEXIN 500 MG PO CAPS
500.0000 mg | ORAL_CAPSULE | Freq: Two times a day (BID) | ORAL | Status: DC
Start: 2016-07-06 — End: 2016-07-06
  Administered 2016-07-06: 500 mg via ORAL
  Filled 2016-07-06: qty 1

## 2016-07-06 MED ORDER — CEPHALEXIN 500 MG PO CAPS
500.0000 mg | ORAL_CAPSULE | Freq: Two times a day (BID) | ORAL | 0 refills | Status: AC
Start: 2016-07-06 — End: 2016-07-18

## 2016-07-06 MED FILL — CEPHALEXIN 500 MG CAPSULE: 500 | 11 days supply | Qty: 23 | Fill #0

## 2016-07-06 NOTE — Care Management Note (Signed)
Case Management Note  Patient Details  Name: Sharon Mcconnell MRN: [id number] Date of Birth: 04/12/1981  Subjective/Objective:       Admitted with sepsis, pyelonephritis.              Action/Plan:  Plan is to d/c to home today. Post hospital follow up scheduled for 07/07/2016 at 11:00 am [facility] Scot Jun[facility]el [facility]  Expected Discharge Date:   07/06/2016            Expected Discharge Plan:  Home/Self Care    Discharge planning Services  CM Consult   Status of Service:  completed  If discussed at Long Length of Stay Meetings, dates discussed:    Additional Comments:  Epifanio LeschesCole, Vastie Douty Hudson, RN 07/06/2016, 10:01 AM

## 2016-07-06 NOTE — Progress Notes (Signed)
Subjective: Sharon Mcconnell is feeling well and is excited about the possibility of discharge home today. She understands the importance of continuing her abx tx orally for the remainder of her course.  Objective: Vital signs in last 24 hours: Vitals:   07/03/16 0600 07/03/16 0700 07/03/16 0800 07/03/16 0937  BP: 106/76 106/68 110/78   Pulse: (!) 111 (!) 121 (!) 111   Resp: (!) 29 (!) 37 14   Temp:    99.2 F (37.3 C)  TempSrc:    Oral  SpO2: 96% 92% 92%   Weight:      Height:       24-hour weight change: Filed Weights   06/30/16 1044 06/30/16 2138  Weight: 120 lb (54.4 kg) 145 lb 15.1 oz (66.2 kg)   Intake/Output:  11/17 0701 - 11/18 0700 In: 2665.5 [I.V.:2565.5; IV Piggyback:100] Out: 2425 [Urine:2425]    Physical Exam: Physical Exam  Constitutional: She appears well-developed. She is cooperative. No distress.  Cardiovascular: Normal rate, regular rhythm, normal heart sounds, intact distal pulses and normal pulses.   Pulmonary/Chest: Effort normal. No respiratory distress. Breasts are symmetrical.  Abdominal: Soft. Bowel sounds are normal. She exhibits no distension. There is no tenderness.  Musculoskeletal: She exhibits no edema.  Neurological: She is alert.  Skin: Skin is warm and dry. She is not diaphoretic.   Labs: CBC:  Recent Labs Lab 06/30/16 1059 07/01/16 0252 07/02/16 0229 07/02/16 1423 07/03/16 0204  WBC 11.3* 6.8 5.2 7.7 6.1  HGB 11.9* 10.9* 9.5* 11.2* 9.8*  HCT 34.1* 31.4* 27.6* 32.3* 28.2*  MCV 81.0 80.5 78.9 79.8 78.3  PLT 109* 73* 79* 96* 93*   Metabolic Panel:  Recent Labs Lab 06/30/16 1059 07/01/16 0252 07/02/16 0229 07/02/16 1423 07/03/16 0204  NA 133* 136 137 138 136  K 3.0* 3.9 3.6 3.9 3.3*  CL 98* 110 115* 114* 113*  CO2 20* 17* 15* 14* 15*  GLUCOSE 208* 160* 136* 116* 147*  BUN 36* 41* 42* 42* 40*  CREATININE 3.33* 3.89* 4.05* 3.90* 3.47*  CALCIUM 8.2* 6.6* 6.8* 7.2* 7.0*  MG  --  1.7  --   --   --   ALT 48 29 24  --   --     ALKPHOS 87 68 61  --   --   BILITOT 2.2* 2.3* 1.9*  --   --   PROT 7.3 6.2* 4.9*  --   --   ALBUMIN 3.0* 2.0* 1.8*  --   --   LIPASE 21  --   --   --   --    BG:  Recent Labs Lab 07/02/16 0820 07/02/16 1206 07/02/16 1618 07/02/16 2137 07/03/16 0833  GLUCAP 100* 115* 152* 121* 177*     Lab Results  Component Value Date   HGBA1C 5.8 (H) 06/30/2016     Microbiology: BCx 11/15: 2/2 E. Coli UCx 11/15: E. Coli (non-ESBL)  BCx 11/18: NGx2D   Medications: Infusions: . dextrose 5 % and 0.45% NaCl 1,000 mL with potassium chloride 20 mEq infusion     Scheduled Medications: .  ceFAZolin (ANCEF) IV  1 g Intravenous Q12H  . sodium chloride flush  3 mL Intravenous Q12H   PRN Medications: acetaminophen **OR** acetaminophen, ondansetron **OR** ondansetron (ZOFRAN) IV  Assessment/Plan: Pt is a 35 y.o. yo female with an uncomplicated PMHx who was admitted on 06/30/2016 with symptoms of fever, chills, N/V, and right flank pain, secondary to pyelonephritis and gram negative bacteremia.  She presented w/ significant  volume depletion, sepsis, and AKI. Now treated with Parenteral Abx for 6 days with resolution of hypotention and tachycardia. Pt has been afebrile for >48 hours.  Pyelonephritis, E. coli Bacteremia: Pt is significantly improved. Plan to discharge w/ 14 days PO abx for completion of therapy. - f/u repeat BCx for ToC - NGx1D - Switch to Cephalexin PO for DC  AKI: Resolving. UOP good. Her mild metabolic acidosis resolving. - d/c IV fluids  Thrombocytopenia: Resolving. Begin lovenox VTE ppx.  Chronic normocytic anemia: Stable.  Hyperbilirubinemia: Resolved.  Length of Stay: 3 day(s)  Dispo: Anticipated discharge today.   Carolynn CommentBryan Jeremiah Tarpley, MD PGY-I Internal Medicine Resident Pager# [phone] 07/03/2016, 11:16 AM

## 2016-07-06 NOTE — Progress Notes (Signed)
NURSING PROGRESS NOTE  Sharon Mcconnell [id number] Discharge Data: 07/06/2016 1:20 PM Attending Provider: Axel Filler, MD PCP:No primary care provider on file.     [name] to be D/C'd Home per MD order.  Discussed with the patient the After Visit Summary and all questions fully answered via an interpreter. All IV's discontinued with no bleeding noted. All belongings returned to patient for patient to take home.   Last Vital Signs:  Blood pressure 127/82, pulse 71, temperature 98.2 F (36.8 C), temperature source Oral, resp. rate 20, height 5' 2" (1.575 m), weight 66.2 kg (145 lb 15.1 oz), last menstrual period 06/29/2016, SpO2 100 %, currently breastfeeding.  Discharge Medication List   Medication List    STOP taking these medications   ACCU-CHEK FASTCLIX LANCETS Misc   acetaminophen 325 MG tablet Commonly known as:  TYLENOL   glucose blood test strip   ibuprofen 600 MG tablet Commonly known as:  ADVIL,MOTRIN     TAKE these medications   ACCU-CHEK AVIVA PLUS w/Device Kit 1 each by Does not apply route 4 (four) times daily.   cephALEXin 500 MG capsule Commonly known as:  KEFLEX Take 1 capsule (500 mg total) by mouth every 12 (twelve) hours.   prenatal multivitamin Tabs tablet Take 1 tablet by mouth daily.

## 2016-07-06 NOTE — Progress Notes (Signed)
PharmD candidates rounding with Internal Medicine teaching service. Asked to counsel on new antibiotic start.  Counseled patient on cephalexin regarding proper administration, possible side effects, and importance of completing course.  Presley RaddleHaley Shalah Estelle and John GiovanniMeredith McSwain PharmD Candidates

## 2016-07-07 ENCOUNTER — Ambulatory Visit: Payer: Medicaid Other | Attending: Internal Medicine | Admitting: Physician Assistant

## 2016-07-07 VITALS — BP 109/74 | HR 82 | Temp 98.2°F | Resp 16 | Wt 147.6 lb

## 2016-07-07 DIAGNOSIS — Z8632 Personal history of gestational diabetes: Secondary | ICD-10-CM | POA: Insufficient documentation

## 2016-07-07 DIAGNOSIS — N10 Acute tubulo-interstitial nephritis: Secondary | ICD-10-CM | POA: Insufficient documentation

## 2016-07-07 DIAGNOSIS — Z79899 Other long term (current) drug therapy: Secondary | ICD-10-CM | POA: Insufficient documentation

## 2016-07-07 DIAGNOSIS — Z8611 Personal history of tuberculosis: Secondary | ICD-10-CM | POA: Diagnosis not present

## 2016-07-07 DIAGNOSIS — N179 Acute kidney failure, unspecified: Secondary | ICD-10-CM | POA: Diagnosis not present

## 2016-07-07 NOTE — Progress Notes (Signed)
Sharon Mcconnell  RCV:[id number]  ZWC:[id number]  DOB - February 27, 1981  Chief Complaint  Patient presents with  . Hospitalization Follow-up       Subjective:   Sharon Mcconnell is a 35 y.o. female with hx of TB, gestational DM, and UTIs is here today for establishment of care. She was hospitalized from 11/15-21, 2017 for UTI-like symptoms. She also had abdominal pain, nausea and vomiting. In the emergency room she was noted to be febrile, hypotensive and tachycardic. Her white blood cell count was 11,000. Her creatinine was elevated at 3.3 and her urinalysis is abnormal. A CT scan of the abdomen showed a large right kidney without hydronephrosis. There was also evidence of cholelithiasis. She was admitted by the internal medicine team for sepsis secondary to Escherichia coli urinary tract infection compensated by acute kidney injury. She was resuscitated with IV fluids. IV antibiotics were initiated. At the time of discharge her creatinine was 1.7.  Her urinary symptoms have subsided. Her abdominal pain has improved greatly. Her appetite is a little decreased. She is a little weak. Overall she feels like she is doing well. No acute complaints today. Has been compliant with her medications.     ROS: GEN: denies fever or chills, denies change in weight Skin: denies lesions or rashes HEENT: denies headache, earache, epistaxis, sore throat, or neck pain LUNGS: denies SHOB, dyspnea, PND, orthopnea CV: denies CP or palpitations ABD: denies abd pain, N or V EXT: denies muscle spasms or swelling; no pain in lower ext, no weakness NEURO: denies numbness or tingling, denies sz, stroke or TIA  ALLERGIES: No Known Allergies  PAST MEDICAL HISTORY: Past Medical History:  Diagnosis Date  . Gestational diabetes 10/02/2013   1 hour 183, 2 hour 159   . Hx of tuberculosis    treated; on 05/20/13 pt denies any knowledge of TB hx  . Hx: UTI (urinary tract infection)     PAST SURGICAL HISTORY: Past Surgical  History:  Procedure Laterality Date  . NO PAST SURGERIES      MEDICATIONS AT HOME: Prior to Admission medications   Medication Sig Start Date End Date Taking? Authorizing Provider  cephALEXin (KEFLEX) 500 MG capsule Take 1 capsule (500 mg total) by mouth every 12 (twelve) hours. 07/06/16 07/18/16 Yes Holley Raring, MD  Blood Glucose Monitoring Suppl (ACCU-CHEK AVIVA PLUS) W/DEVICE KIT 1 each by Does not apply route 4 (four) times daily. Patient not taking: Reported on 07/07/2016 10/29/13   Woodroe Mode, MD  Prenatal Vit-Fe Fumarate-FA (PRENATAL MULTIVITAMIN) TABS tablet Take 1 tablet by mouth daily. Patient not taking: Reported on 07/07/2016 11/12/13   Allen Norris, MD     Objective:   Vitals:   07/07/16 1112  BP: 109/74  Pulse: 82  Resp: 16  Temp: 98.2 F (36.8 C)  TempSrc: Oral  Weight: 147 lb 9.6 oz (67 kg)    Exam General appearance : Awake, alert, not in any distress. Speech Clear. Not toxic looking HEENT: Atraumatic and Normocephalic, pupils equally reactive to light and accomodation Neck: supple, no JVD. No cervical lymphadenopathy.  Chest:Good air entry bilaterally, no added sounds  CVS: S1 S2 regular, no murmurs.  Abdomen: Bowel sounds present, Non tender and not distended with no gaurding, rigidity or rebound. Extremities: B/L Lower Ext shows no edema, both legs are warm to touch   Data Review Lab Results  Component Value Date   HGBA1C 5.8 (H) 06/30/2016    Assessment & Plan  1. Acute pyelonephritis  -Cont  ABX  -hydrate appropriately 2. AKI  -recheck BMP in 1 week   Needs appt with financial couselor Return in about 1 week (around 07/14/2016).  The patient was given clear instructions to go to ER or return to medical center if symptoms don't improve, worsen or new problems develop. The patient verbalized understanding. The patient was told to call to get lab results if they haven't heard anything in the next week.   This note has been created with  Surveyor, quantity. Any transcriptional errors are unintentional.    Zettie Pho, PA-C [facility] [place], [place]   07/07/2016, 12:32 PM

## 2016-07-08 LAB — CULTURE, BLOOD (ROUTINE X 2)
CULTURE: NO GROWTH
Culture: NO GROWTH

## 2016-07-14 ENCOUNTER — Ambulatory Visit: Payer: Medicaid Other

## 2016-07-20 ENCOUNTER — Encounter: Payer: Self-pay | Admitting: Family Medicine

## 2016-07-20 ENCOUNTER — Ambulatory Visit: Payer: Self-pay | Attending: Family Medicine | Admitting: Family Medicine

## 2016-07-20 VITALS — BP 124/83 | HR 74 | Temp 97.6°F | Ht 62.5 in | Wt 135.2 lb

## 2016-07-20 DIAGNOSIS — M546 Pain in thoracic spine: Secondary | ICD-10-CM

## 2016-07-20 DIAGNOSIS — N10 Acute tubulo-interstitial nephritis: Secondary | ICD-10-CM | POA: Insufficient documentation

## 2016-07-20 DIAGNOSIS — N12 Tubulo-interstitial nephritis, not specified as acute or chronic: Secondary | ICD-10-CM

## 2016-07-20 DIAGNOSIS — M62838 Other muscle spasm: Secondary | ICD-10-CM | POA: Insufficient documentation

## 2016-07-20 DIAGNOSIS — R141 Gas pain: Secondary | ICD-10-CM

## 2016-07-20 LAB — POCT URINALYSIS DIPSTICK
Bilirubin, UA: NEGATIVE
Glucose, UA: NEGATIVE
Ketones, UA: NEGATIVE
NITRITE UA: NEGATIVE
PH UA: 6.5
PROTEIN UA: NEGATIVE
Spec Grav, UA: 1.01
UROBILINOGEN UA: 0.2

## 2016-07-20 LAB — COMPLETE METABOLIC PANEL WITH GFR
ALT: 27 U/L (ref 6–29)
AST: 22 U/L (ref 10–30)
Albumin: 4.1 g/dL (ref 3.6–5.1)
Alkaline Phosphatase: 146 U/L — ABNORMAL HIGH (ref 33–115)
BUN: 14 mg/dL (ref 7–25)
CALCIUM: 9.6 mg/dL (ref 8.6–10.2)
CHLORIDE: 101 mmol/L (ref 98–110)
CO2: 29 mmol/L (ref 20–31)
CREATININE: 0.88 mg/dL (ref 0.50–1.10)
GFR, EST NON AFRICAN AMERICAN: 85 mL/min (ref >=60)
Glucose, Bld: 90 mg/dL (ref 65–99)
Potassium: 3.9 mmol/L (ref 3.5–5.3)
Sodium: 138 mmol/L (ref 135–146)
Total Bilirubin: 0.7 mg/dL (ref 0.2–1.2)
Total Protein: 8.8 g/dL — ABNORMAL HIGH (ref 6.1–8.1)

## 2016-07-20 LAB — CBC WITH DIFFERENTIAL/PLATELET
BASOS PCT: 0 %
Basophils Absolute: 0 cells/uL (ref 0–200)
Eosinophils Absolute: 114 cells/uL (ref 15–500)
Eosinophils Relative: 2 %
HCT: 35.3 % (ref 35.0–45.0)
Hemoglobin: 11.7 g/dL (ref 11.7–15.5)
LYMPHS PCT: 33 %
Lymphs Abs: 1881 cells/uL (ref 850–3900)
MCH: 27.5 pg (ref 27.0–33.0)
MCHC: 33.1 g/dL (ref 32.0–36.0)
MCV: 82.9 fL (ref 80.0–100.0)
MONO ABS: 342 {cells}/uL (ref 200–950)
MONOS PCT: 6 %
MPV: 9.9 fL (ref 7.5–12.5)
NEUTROS ABS: 3363 {cells}/uL (ref 1500–7800)
Neutrophils Relative %: 59 %
PLATELETS: 216 10*3/uL (ref 140–400)
RBC: 4.26 MIL/uL (ref 3.80–5.10)
RDW: 14 % (ref 11.0–15.0)
WBC: 5.7 10*3/uL (ref 3.8–10.8)

## 2016-07-20 MED ORDER — SIMETHICONE 125 MG PO CHEW: 125.0000 mg | CHEWABLE_TABLET | Freq: Four times a day (QID) | ORAL | 0 refills | Status: DC | PRN

## 2016-07-20 MED ORDER — SIMETHICONE 125 MG PO CHEW: 125.0000 mg | CHEWABLE_TABLET | Freq: Four times a day (QID) | ORAL | 0 refills | Status: AC | PRN

## 2016-07-20 MED ORDER — CYCLOBENZAPRINE HCL 10 MG PO TABS: 10.0000 mg | ORAL_TABLET | Freq: Two times a day (BID) | ORAL | 0 refills | Status: AC | PRN

## 2016-07-20 MED ORDER — CYCLOBENZAPRINE HCL 10 MG PO TABS: 10.0000 mg | ORAL_TABLET | Freq: Three times a day (TID) | ORAL | 0 refills | Status: DC | PRN

## 2016-07-20 MED FILL — CYCLOBENZAPRINE 10 MG TAB: 10 | 20 days supply | Qty: 40 | Fill #0

## 2016-07-20 NOTE — Progress Notes (Addendum)
Subjective:  Patient ID: Sharon Mcconnell, female    DOB: 06-08-1981  Age: 35 y.o. MRN: [id number]  CC: Hospitalization Follow-up (acute pyelonephritis); Fatigue; and Back Pain   HPI Sharon Mcconnell presents Is a 35 year old female with a history of gestational diabetes mellitus who presents for follow-up on pyelonephritis after completing a course of antibiotic treatment. She was hospitalized for same at which time she developed acute kidney injury with improvement in creatinine from 3.3-1.7 at discharge.  She denies fever, urinary symptoms but does have some thoracic back pain which she rates at a 3/10 and occurs when she takes a deep breath.  Past Medical History:  Diagnosis Date  . Gestational diabetes 10/02/2013   1 hour 183, 2 hour 159   . Hx of tuberculosis    treated; on 05/20/13 pt denies any knowledge of TB hx  . Hx: UTI (urinary tract infection)     Past Surgical History:  Procedure Laterality Date  . NO PAST SURGERIES      No Known Allergies    Outpatient Medications Prior to Visit  Medication Sig Dispense Refill  . Blood Glucose Monitoring Suppl (ACCU-CHEK AVIVA PLUS) W/DEVICE KIT 1 each by Does not apply route 4 (four) times daily. (Patient not taking: Reported on 07/20/2016) 1 kit 0  . Prenatal Vit-Fe Fumarate-FA (PRENATAL MULTIVITAMIN) TABS tablet Take 1 tablet by mouth daily. (Patient not taking: Reported on 07/20/2016) 90 tablet 2   No facility-administered medications prior to visit.     ROS Review of Systems  Constitutional: Negative for activity change, appetite change and fatigue.  HENT: Negative for congestion, sinus pressure and sore throat.   Eyes: Negative for visual disturbance.  Respiratory: Negative for cough, chest tightness, shortness of breath and wheezing.   Cardiovascular: Negative for chest pain and palpitations.  Gastrointestinal: Negative for abdominal distention, abdominal pain and constipation.  Endocrine: Negative for polydipsia.  Genitourinary:  Negative for dysuria and frequency.  Musculoskeletal: Positive for back pain. Negative for arthralgias.  Skin: Negative for rash.  Neurological: Negative for tremors, light-headedness and numbness.  Hematological: Does not bruise/bleed easily.  Psychiatric/Behavioral: Negative for agitation and behavioral problems.    Objective:  BP 124/83 (BP Location: Right Arm, Patient Position: Sitting, Cuff Size: Small)   Pulse 74   Temp 97.6 F (36.4 C) (Oral)   Ht 5' 2.5" (1.588 m)   Wt 135 lb 3.2 oz (61.3 kg)   LMP 06/29/2016   SpO2 98%   BMI 24.33 kg/m   BP/Weight 07/20/2016 07/07/2016 62/37/6283  Systolic BP 151 761 607  Diastolic BP 83 74 82  Wt. (Lbs) 135.2 147.6 -  BMI 24.33 27 -      Physical Exam  Constitutional: She is oriented to person, place, and time. She appears well-developed and well-nourished.  Cardiovascular: Normal rate, normal heart sounds and intact distal pulses.   No murmur heard. Pulmonary/Chest: Effort normal and breath sounds normal. She has no wheezes. She has no rales. She exhibits no tenderness.  Abdominal: Soft. Bowel sounds are normal. She exhibits no distension and no mass. There is no tenderness.  Musculoskeletal: Normal range of motion.  No CVA tenderness  Neurological: She is alert and oriented to person, place, and time.     Assessment & Plan:   1. Pyelonephritis UA positive for trace LE; completed a course of abx  So will hold off on placing on antibiotics again Increase hydration. - COMPLETE METABOLIC PANEL WITH GFR - CBC with Differential/Platelet  2. Acute right-sided thoracic  back pain Muscle spasm - cyclobenzaprine (FLEXERIL) 10 MG tablet; Take 1 tablet (10 mg total) by mouth 2 (two) times daily as needed for muscle spasms.  Dispense: 40 tablet; Refill: 0  3. Gas pain Patient is not sure if this is related to gas pain. We'll treat presumptively - simethicone (MYLICON) 786 MG chewable tablet; Chew 1 tablet (125 mg total) by mouth  every 6 (six) hours as needed for flatulence.  Dispense: 120 tablet; Refill: 0   Meds ordered this encounter  Medications  . DISCONTD: cyclobenzaprine (FLEXERIL) 10 MG tablet    Sig: Take 1 tablet (10 mg total) by mouth 3 (three) times daily as needed for muscle spasms.    Dispense:  30 tablet    Refill:  0  . DISCONTD: simethicone (MYLICON) 754 MG chewable tablet    Sig: Chew 1 tablet (125 mg total) by mouth every 6 (six) hours as needed for flatulence.    Dispense:  30 tablet    Refill:  0  . simethicone (MYLICON) 492 MG chewable tablet    Sig: Chew 1 tablet (125 mg total) by mouth every 6 (six) hours as needed for flatulence.    Dispense:  120 tablet    Refill:  0  . cyclobenzaprine (FLEXERIL) 10 MG tablet    Sig: Take 1 tablet (10 mg total) by mouth 2 (two) times daily as needed for muscle spasms.    Dispense:  40 tablet    Refill:  0    Follow-up: Return in about 3 months (around 10/18/2016) for Coordination of care.   Arnoldo Morale MD

## 2016-07-20 NOTE — Progress Notes (Signed)
On an antibiotic for the past two weeks.  She didn't bring.

## 2016-08-06 ENCOUNTER — Telehealth: Payer: Self-pay

## 2016-08-06 NOTE — Progress Notes (Signed)
Writer was unable to reach patient through [employer].  Writer sent patient a result letter in the mail.

## 2016-08-06 NOTE — Telephone Encounter (Signed)
-----   Message from Jaclyn ShaggyEnobong Amao, MD sent at 07/21/2016  2:09 PM EST ----- Kidney function is down to normal

## 2016-08-06 NOTE — Telephone Encounter (Signed)
Writer called patient through [employer] and was unable to reach her.  No VM was left.

## 2018-05-12 IMAGING — CT CT ABD-PELV W/O CM
2 of 4 series · 16 of 46 positions shown, 18 images · non-contrast
Comparison: None.

CLINICAL DATA: Right upper quadrant and flank pain for 4 days.
Hypotension. CBC pending.

EXAM:
CT ABDOMEN AND PELVIS WITHOUT CONTRAST
TECHNIQUE: Multidetector CT imaging of the abdomen and pelvis was performed
following the standard protocol without IV contrast.

[Series 2: a/p w/o 5mm · axial · non-contrast · 0.72mm/px · z∈[+955,+1390]mm · 13 of 95 slices shown, 15 images]
[im 4/95  soft-tissue]
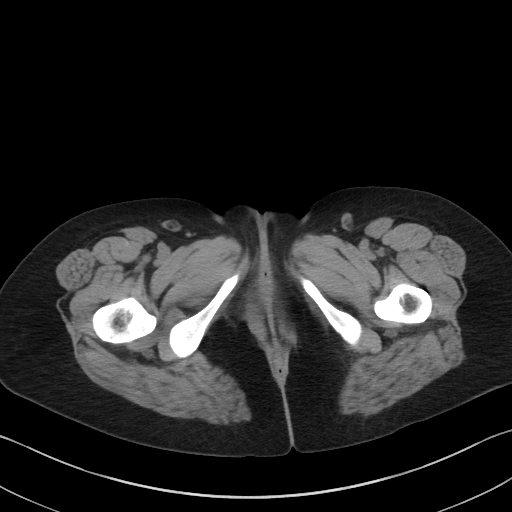
[im 4/95  bone]
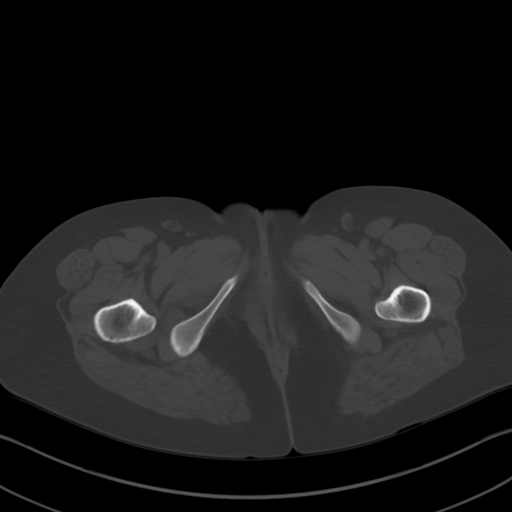
[im 12/95  soft-tissue]
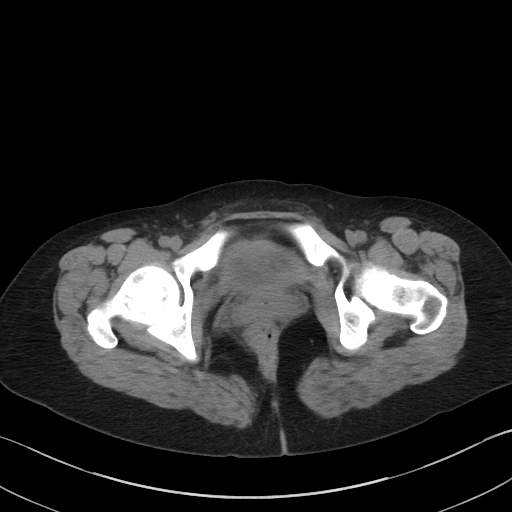
[im 19/95  soft-tissue]
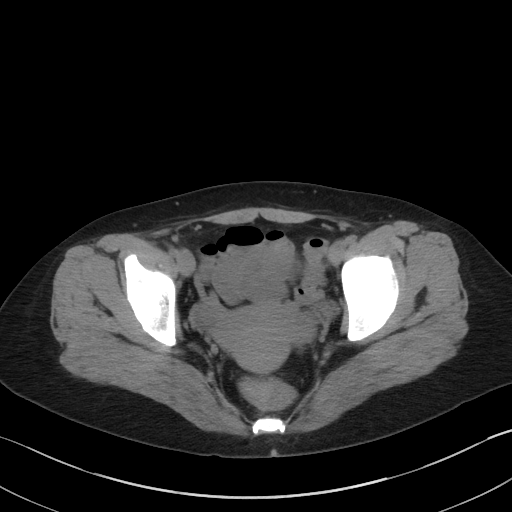
[im 27/95  soft-tissue]
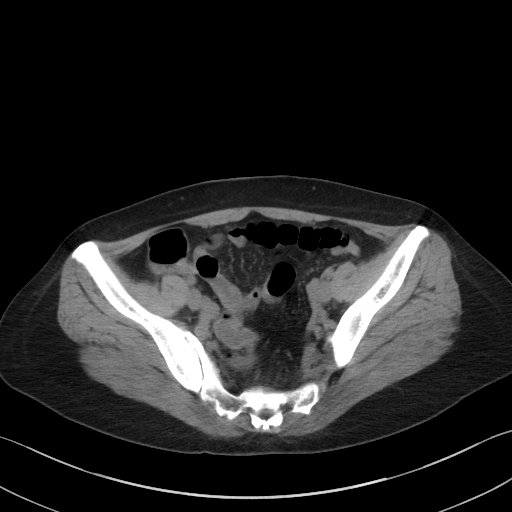
[im 34/95  soft-tissue]
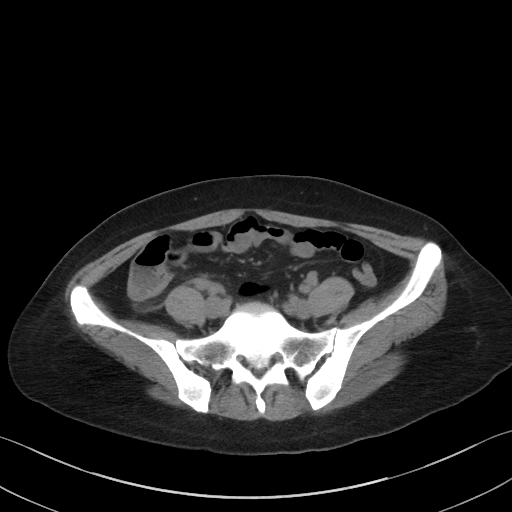
[im 42/95  soft-tissue]
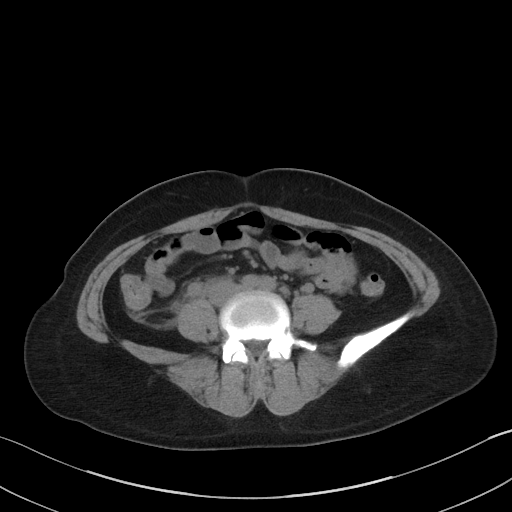
[im 49/95  soft-tissue]
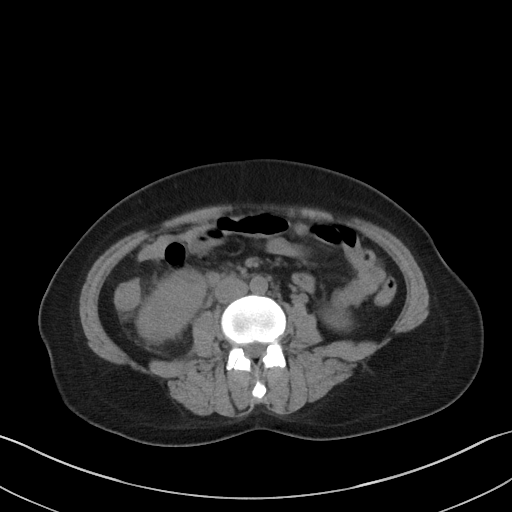
[im 53/95  soft-tissue]
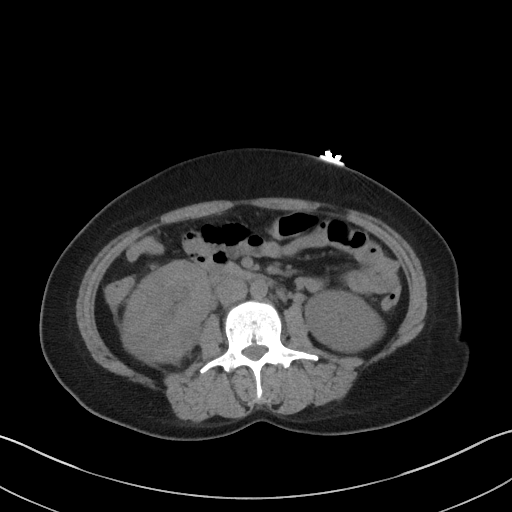
[im 61/95  soft-tissue]
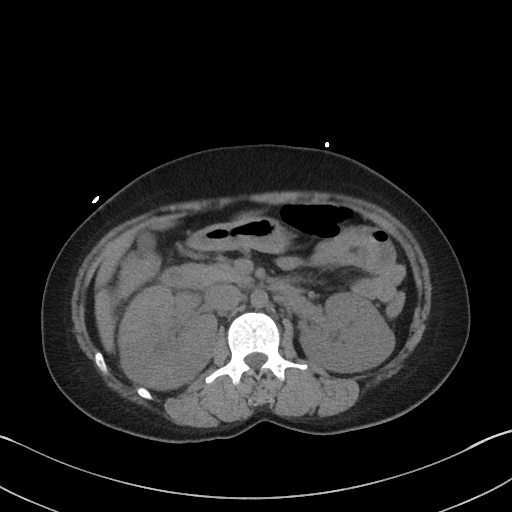
[im 61/95  bone]
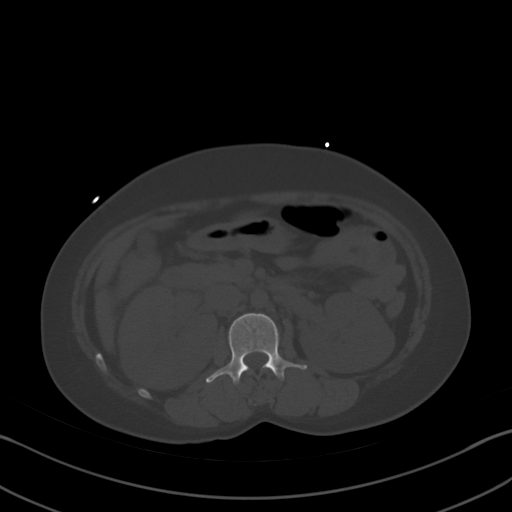
[im 68/95  soft-tissue]
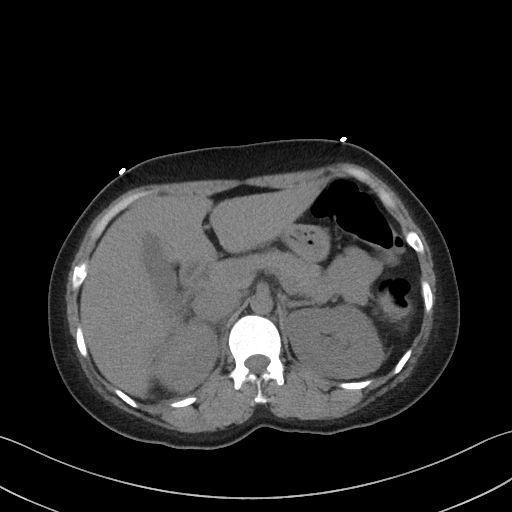
[im 76/95  soft-tissue]
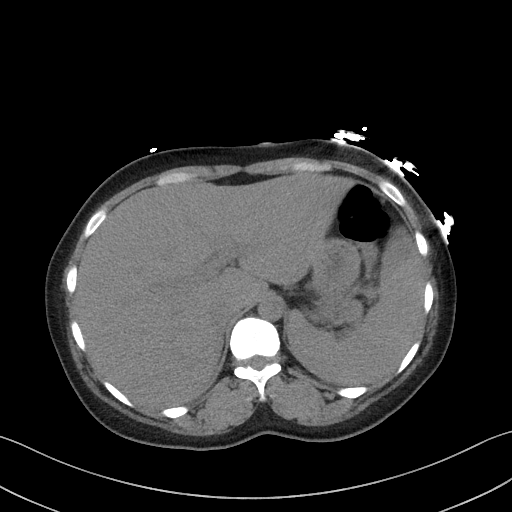
[im 83/95  soft-tissue]
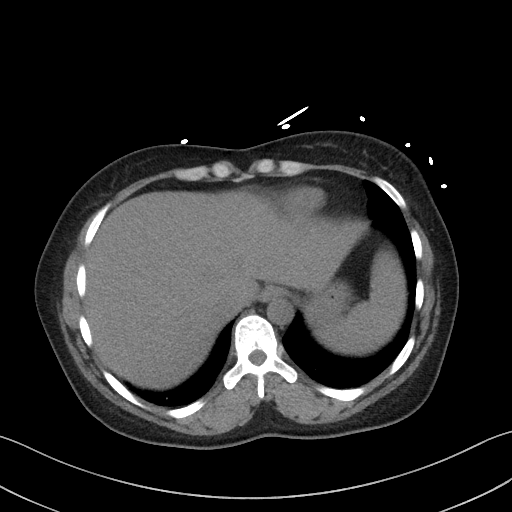
[im 91/95  soft-tissue]
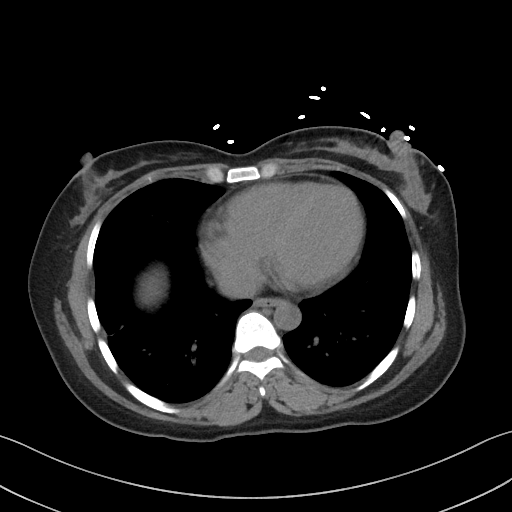

[Series 5: a/p w/o cor · coronal · non-contrast · 0.63mm/px · 3 of 118 slices shown]
[im 40/118  soft-tissue]
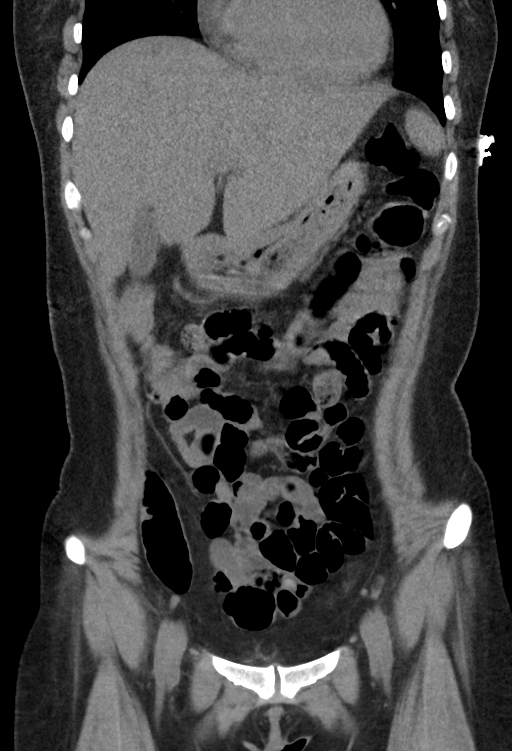
[im 53/118  soft-tissue]
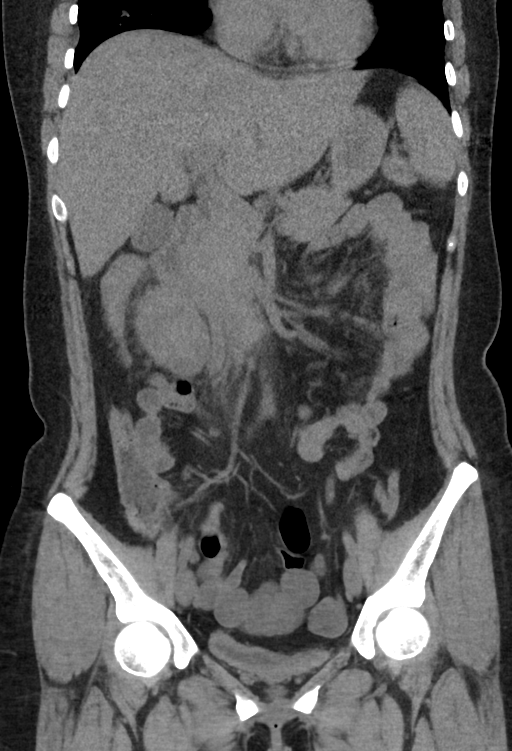
[im 66/118  soft-tissue]
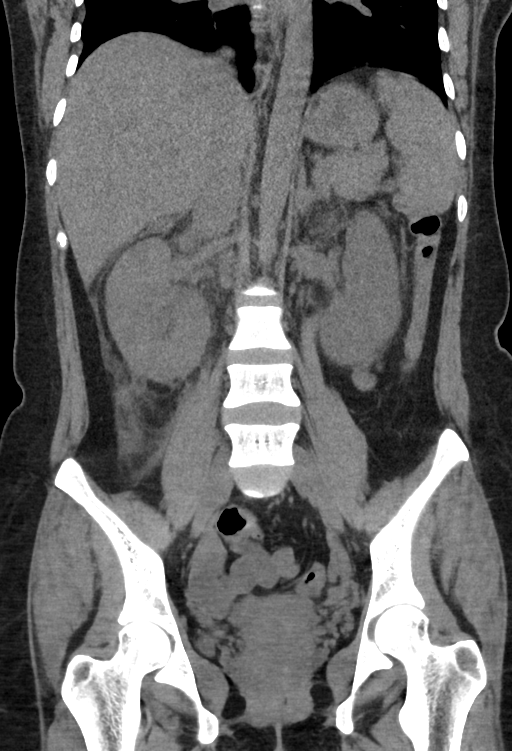

[16 of 46 positions shown; findings below may reference images not displayed]

FINDINGS: Lower chest:  Mild right basilar atelectasis.

Hepatobiliary: No focal liver abnormality.Cholelithiasis. No changes
of cholecystitis.

Pancreas: Unremarkable.

Spleen: Unremarkable.

Adrenals/Urinary Tract: Negative adrenals. Expanded right kidney
with heterogeneous cortex. There is fat infiltration around the
kidney and upper ureter. No hydronephrosis or nephrolithiasis. The
right renal vein is not hyperdense compared to the left. The left
kidney is also prominent in size and mildly indistinct.

Stomach/Bowel:  No obstruction. No appendicitis.

Vascular/Lymphatic: No acute vascular abnormality. No mass or
adenopathy.

Reproductive:No pathologic findings.

Other: No ascites or pneumoperitoneum.

Musculoskeletal: No acute abnormalities.
IMPRESSION: 1. Large and heterogeneous right kidney without hydronephrosis,
likely advanced pyelonephritis in this setting. Left kidney also
appears large and may be affected to a lesser degree.
2. Cholelithiasis.
3. Normal appendix.
